# Patient Record
Sex: Female | Born: 1955 | Race: White | Hispanic: No | Marital: Married | Smoking: Former smoker
Health system: Southern US, Community
[De-identification: ages and names within clinical notes are randomized; demographics above are authoritative.]

## PROBLEM LIST (undated history)

## (undated) DIAGNOSIS — K7460 Unspecified cirrhosis of liver: Secondary | ICD-10-CM

## (undated) DIAGNOSIS — Z9889 Other specified postprocedural states: Secondary | ICD-10-CM

## (undated) DIAGNOSIS — N200 Calculus of kidney: Secondary | ICD-10-CM

## (undated) DIAGNOSIS — K219 Gastro-esophageal reflux disease without esophagitis: Secondary | ICD-10-CM

## (undated) DIAGNOSIS — D696 Thrombocytopenia, unspecified: Secondary | ICD-10-CM

## (undated) DIAGNOSIS — K859 Acute pancreatitis without necrosis or infection, unspecified: Secondary | ICD-10-CM

## (undated) DIAGNOSIS — R7303 Prediabetes: Secondary | ICD-10-CM

## (undated) DIAGNOSIS — K7581 Nonalcoholic steatohepatitis (NASH): Secondary | ICD-10-CM

## (undated) DIAGNOSIS — Z85820 Personal history of malignant melanoma of skin: Secondary | ICD-10-CM

## (undated) HISTORY — PX: APPENDECTOMY: SHX54

## (undated) HISTORY — DX: Thrombocytopenia, unspecified: D69.6

## (undated) HISTORY — PX: VAGINAL HYSTERECTOMY: SUR661

## (undated) HISTORY — PX: SKIN BIOPSY: SHX1

## (undated) HISTORY — DX: Calculus of kidney: N20.0

## (undated) HISTORY — DX: Nonalcoholic steatohepatitis (NASH): K75.81

## (undated) HISTORY — DX: Other specified postprocedural states: Z98.890

## (undated) HISTORY — DX: Unspecified cirrhosis of liver: K74.60

## (undated) HISTORY — DX: Personal history of malignant melanoma of skin: Z85.820

## (undated) HISTORY — DX: Acute pancreatitis without necrosis or infection, unspecified: K85.90

## (undated) HISTORY — PX: CHOLECYSTECTOMY: SHX55

## (undated) HISTORY — DX: Gastro-esophageal reflux disease without esophagitis: K21.9

## (undated) HISTORY — DX: Prediabetes: R73.03

## (undated) HISTORY — PX: LITHOTRIPSY: SUR834

---
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
## 2011-09-24 ENCOUNTER — Encounter: Payer: BC Managed Care – PPO | Admitting: Internal Medicine

## 2011-09-24 DIAGNOSIS — D696 Thrombocytopenia, unspecified: Secondary | ICD-10-CM

## 2011-09-24 DIAGNOSIS — C439 Malignant melanoma of skin, unspecified: Secondary | ICD-10-CM

## 2011-11-24 ENCOUNTER — Encounter: Payer: BC Managed Care – PPO | Admitting: Internal Medicine

## 2011-11-24 DIAGNOSIS — C439 Malignant melanoma of skin, unspecified: Secondary | ICD-10-CM

## 2011-11-24 DIAGNOSIS — D693 Immune thrombocytopenic purpura: Secondary | ICD-10-CM

## 2011-12-08 ENCOUNTER — Encounter: Payer: BC Managed Care – PPO | Admitting: Internal Medicine

## 2011-12-08 DIAGNOSIS — K7460 Unspecified cirrhosis of liver: Secondary | ICD-10-CM

## 2011-12-08 DIAGNOSIS — R748 Abnormal levels of other serum enzymes: Secondary | ICD-10-CM

## 2011-12-08 DIAGNOSIS — D696 Thrombocytopenia, unspecified: Secondary | ICD-10-CM

## 2011-12-11 ENCOUNTER — Encounter (INDEPENDENT_AMBULATORY_CARE_PROVIDER_SITE_OTHER): Payer: Self-pay | Admitting: *Deleted

## 2012-01-25 ENCOUNTER — Other Ambulatory Visit (INDEPENDENT_AMBULATORY_CARE_PROVIDER_SITE_OTHER): Payer: Self-pay | Admitting: *Deleted

## 2012-01-25 ENCOUNTER — Other Ambulatory Visit (INDEPENDENT_AMBULATORY_CARE_PROVIDER_SITE_OTHER): Payer: Self-pay | Admitting: Internal Medicine

## 2012-01-25 ENCOUNTER — Telehealth (INDEPENDENT_AMBULATORY_CARE_PROVIDER_SITE_OTHER): Payer: Self-pay | Admitting: *Deleted

## 2012-01-25 ENCOUNTER — Encounter (INDEPENDENT_AMBULATORY_CARE_PROVIDER_SITE_OTHER): Payer: Self-pay | Admitting: Internal Medicine

## 2012-01-25 ENCOUNTER — Ambulatory Visit (INDEPENDENT_AMBULATORY_CARE_PROVIDER_SITE_OTHER): Payer: BC Managed Care – PPO | Admitting: Internal Medicine

## 2012-01-25 VITALS — BP 124/76 | HR 70 | Temp 100.6°F | Resp 20 | Ht 60.0 in | Wt 166.0 lb

## 2012-01-25 DIAGNOSIS — Z1211 Encounter for screening for malignant neoplasm of colon: Secondary | ICD-10-CM

## 2012-01-25 DIAGNOSIS — J329 Chronic sinusitis, unspecified: Secondary | ICD-10-CM | POA: Insufficient documentation

## 2012-01-25 DIAGNOSIS — K219 Gastro-esophageal reflux disease without esophagitis: Secondary | ICD-10-CM | POA: Insufficient documentation

## 2012-01-25 DIAGNOSIS — K7460 Unspecified cirrhosis of liver: Secondary | ICD-10-CM

## 2012-01-25 MED ORDER — AZITHROMYCIN 250 MG PO TABS: ORAL_TABLET | ORAL | Status: DC

## 2012-01-25 NOTE — Telephone Encounter (Signed)
Patient needs movi prep 

## 2012-01-25 NOTE — Progress Notes (Signed)
CONSULTING PHYSICIAN: Boris M. Darovsky, M.D.  PRIMARY CARE PHYSICIAN: Marrian Salvage, PA-C/Mark Leary Roca, D.O.  REASON FOR CONSULTATION: New diagnosis of cirrhosis.  HISTORY OF PRESENT ILLNESS: Stephanie Bauer is a 56 year old, Caucasian female,  who is being evaluated at request by Dr. Earma Reading whom she saw for  thrombocytopenia. The patient had abdominal ultrasound which suggested  cirrhotic changes. The patient's hepatitis B surface antigen and  hepatitis C virus antibody were negative. With this information, the  patient referred for further evaluation.  The patient reports that 2 years ago she was noted to have mildly  elevated transaminases, felt to be secondary to fatty liver, but she did  not have imaging or other studies. Lately, she has been feeling tired,  but she has been able to do her usual household work and baby-sit  grandchildren without any difficulty. There is no history of icteric  hepatitis. Her appetite is fair. Since she found out she has  cirrhosis, she has been more active and has lost 8 pounds over a period  of 2 months. She has had occasional nausea and pain over right rib  cage, but not daily. She denies abdominal pain, melena, or rectal  bleeding. She has never been screened for colorectal carcinoma, and she  also has not received hep A or B vaccination. There is no history of  prior blood transfusion or tattoos. There is also no history of IV drug  use. She also denies pruritus.  CURRENT MEDICATIONS:  1. Alprazolam 0.5 mg p.o. at bedtime.  2. Alka-Seltzer plus p.o. daily p.r.n.  3. Clobetasol propionate cream applied to skin rash right leg daily  p.r.n.  4. __________ hydrogel to be applied to skin daily p.r.n.  5. Benadryl 25 mg p.o. q.6 p.r.n.  6. Citalopram 10 mg p.o. daily.  7. Estradiol 0.05 mg per 24-hour patch to skin twice weekly.  8. Omeprazole 20 mg p.o. q.a.m.  9. Oxymetazoline nasal spray 2 sprays to each nostril daily.  REVIEW OF THE SYSTEMS:  Negative for dysphagia. She feels the heartburn  is well controlled with therapy. She does complain of nasal congestion,  thick drainage, and low-grade fever.  PAST MEDICAL HISTORY: She has history of kidney stones initially  diagnosed 10 years ago. She has had lithotripsy. She did have single  episode of hematuria recently. She had skin lesion removed from her  anterior chest in July, which turned out to be melanoma and she had wide  excision. GERD of 2 years duration. She had vaginal hysterectomy 10  years ago.  History of skin rash for which she was seen by dermatologist, but does  not remember diagnosis. She has been on Escitalopram for mood swings  since she had hysterectomy. One month ago, she was told she is  borderline diabetic.  ALLERGIES: None known.  FAMILY HISTORY: Father was also borderline diabetic and died of CVA at  age 64. Mother is 54, is in good health. She has a younger sister, who  is accompanying her. She has ulcerative colitis, and she has a brother  age 54 who is in good health.  SOCIAL HISTORY: She is married. Her husband is accompanying her today.  They have 3 children and they are all in good health age is 54, 54, and  42. She worked in office for few years, but presently housewife. She  has never smoked cigarettes. She drinks alcohol socially, but not every  day. She has had more alcohol over the last 1-2 years, but no more  than  8-10 drinks per month.  PHYSICAL EXAMINATION: VITAL SIGNS: Weight 166 pounds. She is 54  inches tall. Pulse 70 per minute and regular, blood pressure 124/76,  respiratory rate is 20, and temp is 100.6. Her BMI is 32.42.  HEENT: Conjunctivae are pink. Sclerae are nonicteric. No calf rings  are noted. Oropharyngeal mucosa is normal.  NECK: No neck masses or thyromegaly noted. She has linear scar over  her anterior chest. No spider angioma are noted.  CARDIAC: With regular rhythm. Normal S1 and S2. No murmur or gallop  noted.  LUNGS:  Clear to auscultation.  ABDOMEN: Full. It is soft and nontender without hepatosplenomegaly.  EXTREMITIES: No peripheral edema or clubbing noted. Asterixis absent.  LABORATORY DATA: From November 24, 2011; WBC 5.0, H and H 13.9 and  40.9, MCV 91.6, platelet count 75,000. Glucose 122, BUN 16, creatinine  0.52. Serum sodium 138, potassium 3.5, chloride 103, CO2 of 28. Serum  calcium 9.0, bilirubin 0.9, AP 119, AST 66, ALT 55, total protein 8.1  with albumin of 3.8. CBC from December 08, 2011, WBC 4.6, H and H 14.1  and 41.5, platelet count 76,000. Lab data from September 24, 2011, hepatitis  B surface antibody is negative and hepatitis C virus antibody negative.  Upper abdominal ultrasound from November 30, 2011, reveals nodular  hepatic contour suggesting cirrhosis. No hepatic lesions identified.  No evidence of cholelithiasis or gallbladder wall thickening. Spleen  measures 11.7. No evidence of ascites.  ASSESSMENT: Stephanie Bauer is a 56 year old, Caucasian female, who was recently  evaluated for thrombocytopenia and mildly elevated transaminases and  suspected to have cirrhosis based on hepatic contour on ultrasonography.  She has history of elevated transaminases initially noted about 2 years  ago, and felt to be fatty liver. She has weight problems, but her BMI  is only 32.4. Therefore, I am not convinced that her cirrhosis or  chronic liver disease is secondary to non-alcoholic fatty liver disease.  It is possible that she has cryptogenic cirrhosis or other etiology.  Even though, she has thrombocytopenia, hepatic function appears to be  well preserved. She definitely needs to become more physically active  and try to lose another 25 pounds, which would help a great deal even if  primary cause of a liver disease was not non-alcoholic fatty liver  disease.  The patient advised not to eat raw fish.  She will talk with Ms. Marrian Salvage, PA-C, about getting vaccination  for hepatitis A and B.  She  will go to the lab for sed rate, ANA, AMA, SMA, serum ceruloplasmin,  alpha-1 antitrypsin, serum ferritin, INR alpha-fetoprotein. I would  also check hepatitis B surface antigen.  We will schedule the patient for abdominopelvic CT with contrast. She  will also need to undergo EGD looking for varices along with screening  colonoscopy. Whether or not, she would need liver biopsy will be  deferred until all of the studies have been completed.  The patient and her husband had several questions that were answered. I  suggested that she should keep alcohol intake to minimum rather than  weekly.  We appreciate the opportunity to participate in the care of this nice  lady.

## 2012-01-25 NOTE — Patient Instructions (Signed)
Physician will contact you with results of blood work when completed. Abdominopelvic CT to be scheduled. EGD and colonoscopy to be scheduled. Do not eat raw fish and keep alcohol intake to minimal as discussed.

## 2012-01-26 MED ORDER — PEG-KCL-NACL-NASULF-NA ASC-C 100 G PO SOLR: 1.0000 | Freq: Once | ORAL | Status: DC

## 2012-01-27 ENCOUNTER — Telehealth (INDEPENDENT_AMBULATORY_CARE_PROVIDER_SITE_OTHER): Payer: Self-pay | Admitting: *Deleted

## 2012-01-27 LAB — ALPHA-1-ANTITRYPSIN: A-1 Antitrypsin, Ser: 154 mg/dL (ref 90–200)

## 2012-01-27 LAB — CERULOPLASMIN: Ceruloplasmin: 25 mg/dL (ref 20–60)

## 2012-01-27 NOTE — Consult Note (Signed)
Stephanie Bauer, Stephanie Bauer               ACCOUNT NO.:  [id number]  MEDICAL RECORD NO.:  [id number]  LOCATION:                                 FACILITY:  PHYSICIAN:  Lionel December, M.D.    DATE OF BIRTH:  1956-01-04  DATE OF CONSULTATION:  01/25/2012 DATE OF DISCHARGE:                                CONSULTATION   CONSULTING PHYSICIAN:  Boris M. Darovsky, M.D.  PRIMARY CARE PHYSICIAN:  Marrian Salvage, PA-C/Mark Leary Roca, D.O.  REASON FOR CONSULTATION:  New diagnosis of cirrhosis.  HISTORY OF PRESENT ILLNESS:  Stephanie Bauer is a 56 year old, Caucasian female, who is being evaluated at request by Dr. Earma Reading whom she saw for thrombocytopenia.  The patient had abdominal ultrasound which suggested cirrhotic changes.  The patient's hepatitis B surface antigen and hepatitis C virus antibody were negative.  With this information, the patient referred for further evaluation.  The patient reports that 2 years ago she was noted to have mildly elevated transaminases, felt to be secondary to fatty liver, but she did not have imaging or other studies.  Lately, she has been feeling tired, but she has been able to do her usual household work and baby-sit grandchildren without any difficulty.  There is no history of icteric hepatitis.  Her appetite is fair.  Since she found out she has cirrhosis, she has been more active and has lost 8 pounds over a period of 2 months.  She has had occasional nausea and pain over right rib cage, but not daily.  She denies abdominal pain, melena, or rectal bleeding.  She has never been screened for colorectal carcinoma, and she also has not received hep A or B vaccination.  There is no history of prior blood transfusion or tattoos.  There is also no history of IV drug use.  She also denies pruritus.  CURRENT MEDICATIONS: 1. Alprazolam 0.5 mg p.o. at bedtime. 2. Alka-Seltzer plus p.o. daily p.r.n. 3. Clobetasol propionate cream applied to skin rash right leg daily  p.r.n. 4. __________ hydrogel to be applied to skin daily p.r.n. 5. Benadryl 25 mg p.o. q.6 p.r.n. 6. Citalopram 10 mg p.o. daily. 7. Estradiol 0.05 mg per 24-hour patch to skin twice weekly. 8. Omeprazole 20 mg p.o. q.a.m. 9. Oxymetazoline nasal spray 2 sprays to each nostril daily.  REVIEW OF THE SYSTEMS:  Negative for dysphagia.  She feels the heartburn is well controlled with therapy.  She does complain of nasal congestion, thick drainage, and low-grade fever.  PAST MEDICAL HISTORY:  She has history of kidney stones initially diagnosed 10 years ago.  She has had lithotripsy.  She did have single episode of hematuria recently.  She had skin lesion removed from her anterior chest in July, which turned out to be melanoma and she had wide excision.  GERD of 2 years duration.  She had vaginal hysterectomy 10 years ago.  History of skin rash for which she was seen by dermatologist, but does not remember diagnosis.  She has been on Escitalopram for mood swings since she had hysterectomy.  One month ago, she was told she is borderline diabetic.  ALLERGIES:  None known.  FAMILY HISTORY:  Father was also  borderline diabetic and died of CVA at age 34.  Mother is 19, is in good health.  She has a younger sister, who is accompanying her.  She has ulcerative colitis, and she has a brother age 39 who is in good health.  SOCIAL HISTORY:  She is married.  Her husband is accompanying her today. They have 3 children and they are all in good health age is 29, 21, and 76.  She worked in office for few years, but presently housewife.  She has never smoked cigarettes.  She drinks alcohol socially, but not every day.  She has had more alcohol over the last 1-2 years, but no more than 8-10 drinks per month.  PHYSICAL EXAMINATION:  VITAL SIGNS:  Weight 166 pounds.  She is 60 inches tall.  Pulse 70 per minute and regular, blood pressure 124/76, respiratory rate is 20, and temp is 100.6.  Her BMI is  32.42. HEENT:  Conjunctivae are pink.  Sclerae are nonicteric.  No calf rings are noted.  Oropharyngeal mucosa is normal. NECK:  No neck masses or thyromegaly noted.  She has linear scar over her anterior chest.  No spider angioma are noted. CARDIAC:  With regular rhythm.  Normal S1 and S2.  No murmur or gallop noted. LUNGS:  Clear to auscultation. ABDOMEN:  Full.  It is soft and nontender without hepatosplenomegaly. EXTREMITIES:  No peripheral edema or clubbing noted.  Asterixis absent.  LABORATORY DATA:  From November 24, 2011; WBC 5.0, H and H 13.9 and 40.9, MCV 91.6, platelet count 75,000.  Glucose 122, BUN 16, creatinine 0.52.  Serum sodium 138, potassium 3.5, chloride 103, CO2 of 28.  Serum calcium 9.0, bilirubin 0.9, AP 119, AST 66, ALT 55, total protein 8.1 with albumin of 3.8.  CBC from December 08, 2011, WBC 4.6, H and H 14.1 and 41.5, platelet count 76,000.  Lab data from September 24, 2011, hepatitis B surface antibody is negative and hepatitis C virus antibody negative. Upper abdominal ultrasound from November 30, 2011, reveals nodular hepatic contour suggesting cirrhosis.  No hepatic lesions identified. No evidence of cholelithiasis or gallbladder wall thickening.  Spleen measures 11.7.  No evidence of ascites.  ASSESSMENT:  Stephanie Bauer is a 56 year old, Caucasian female, who was recently evaluated for thrombocytopenia and mildly elevated transaminases and suspected to have cirrhosis based on hepatic contour on ultrasonography. She has history of elevated transaminases initially noted about 2 years ago, and felt to be fatty liver.  She has weight problems, but her BMI is only 32.4.  Therefore, I am not convinced that her cirrhosis or chronic liver disease is secondary to non-alcoholic fatty liver disease. It is possible that she has cryptogenic cirrhosis or other etiology.  Even though, she has thrombocytopenia, hepatic function appears to be well preserved.  She definitely needs  to become more physically active and try to lose another 25 pounds, which would help a great deal even if primary cause of a liver disease was not non-alcoholic fatty liver disease.  The patient advised not to eat raw fish.  She will talk with Ms. Marrian Salvage, PA-C, about getting vaccination for hepatitis A and B.  She will go to the lab for sed rate, ANA, AMA, SMA, serum ceruloplasmin, alpha-1 antitrypsin, serum ferritin, INR alpha-fetoprotein.  I would also check hepatitis B surface antigen.  We will schedule the patient for abdominopelvic CT with contrast.  She will also need to undergo EGD looking for varices along with screening colonoscopy.  Whether or  not, she would need liver biopsy will be deferred until all of the studies have been completed.  The patient and her husband had several questions that were answered.  I suggested that she should keep alcohol intake to minimum rather than weekly.  We appreciate the opportunity to participate in the care of this nice lady.          ______________________________ Lionel December, M.D.     NR/MEDQ  D:  01/25/2012  T:  01/26/2012  Job:  [id number]  cc:   Lebron Conners. Darovsky, M.D. Fax: [phone]  Lorelei Pont, D.O.  Marrian Salvage, PA-C

## 2012-01-27 NOTE — Telephone Encounter (Signed)
Per Dr.Rehman need to add the Hep B Surface Antigen, I called [facility] spoke with Jamesetta So and this has been added on.

## 2012-02-01 ENCOUNTER — Ambulatory Visit (HOSPITAL_COMMUNITY)
Admission: RE | Admit: 2012-02-01 | Discharge: 2012-02-01 | Disposition: A | Discharge status: Home / Self Care | Payer: BC Managed Care – PPO | Source: Ambulatory Visit | Attending: Internal Medicine | Admitting: Internal Medicine

## 2012-02-01 DIAGNOSIS — K7460 Unspecified cirrhosis of liver: Secondary | ICD-10-CM | POA: Insufficient documentation

## 2012-02-01 MED ORDER — IOHEXOL 300 MG/ML  SOLN
100.0000 mL | Freq: Once | INTRAMUSCULAR | Status: AC | PRN
  Administered 2012-02-01: 100 mL via INTRAVENOUS

## 2012-02-03 ENCOUNTER — Encounter (HOSPITAL_COMMUNITY): Payer: Self-pay | Admitting: Pharmacy Technician

## 2012-02-10 ENCOUNTER — Encounter (INDEPENDENT_AMBULATORY_CARE_PROVIDER_SITE_OTHER): Payer: Self-pay

## 2012-02-18 MED ORDER — SODIUM CHLORIDE 0.45 % IV SOLN
INTRAVENOUS | Status: DC
  Administered 2012-02-19: 1000 mL via INTRAVENOUS

## 2012-02-19 ENCOUNTER — Encounter (HOSPITAL_COMMUNITY)
Admission: RE | Disposition: A | Discharge status: Home / Self Care | Payer: Self-pay | Source: Ambulatory Visit | Attending: Internal Medicine

## 2012-02-19 ENCOUNTER — Encounter (HOSPITAL_COMMUNITY): Payer: Self-pay | Admitting: *Deleted

## 2012-02-19 ENCOUNTER — Ambulatory Visit (HOSPITAL_COMMUNITY)
Admission: RE | Admit: 2012-02-19 | Discharge: 2012-02-19 | Disposition: A | Discharge status: Home / Self Care | Payer: BC Managed Care – PPO | Source: Ambulatory Visit | Attending: Internal Medicine | Admitting: Internal Medicine

## 2012-02-19 DIAGNOSIS — I8500 Esophageal varices without bleeding: Secondary | ICD-10-CM

## 2012-02-19 DIAGNOSIS — K766 Portal hypertension: Secondary | ICD-10-CM | POA: Insufficient documentation

## 2012-02-19 DIAGNOSIS — Z1211 Encounter for screening for malignant neoplasm of colon: Secondary | ICD-10-CM

## 2012-02-19 DIAGNOSIS — K7460 Unspecified cirrhosis of liver: Secondary | ICD-10-CM

## 2012-02-19 DIAGNOSIS — K5730 Diverticulosis of large intestine without perforation or abscess without bleeding: Secondary | ICD-10-CM | POA: Insufficient documentation

## 2012-02-19 DIAGNOSIS — K319 Disease of stomach and duodenum, unspecified: Secondary | ICD-10-CM | POA: Insufficient documentation

## 2012-02-19 DIAGNOSIS — K6389 Other specified diseases of intestine: Secondary | ICD-10-CM

## 2012-02-19 HISTORY — PX: COLONOSCOPY WITH ESOPHAGOGASTRODUODENOSCOPY (EGD): SHX5779

## 2012-02-19 SURGERY — COLONOSCOPY WITH ESOPHAGOGASTRODUODENOSCOPY (EGD)
Anesthesia: Moderate Sedation

## 2012-02-19 MED ORDER — MEPERIDINE HCL 50 MG/ML IJ SOLN
INTRAMUSCULAR | Status: AC
  Filled 2012-02-19: qty 1

## 2012-02-19 MED ORDER — STERILE WATER FOR IRRIGATION IR SOLN
Status: DC | PRN
  Administered 2012-02-19: 17:00:00

## 2012-02-19 MED ORDER — MEPERIDINE HCL 50 MG/ML IJ SOLN
INTRAMUSCULAR | Status: DC | PRN
  Administered 2012-02-19 (×2): 25 mg via INTRAVENOUS

## 2012-02-19 MED ORDER — BUTAMBEN-TETRACAINE-BENZOCAINE 2-2-14 % EX AERO
INHALATION_SPRAY | CUTANEOUS | Status: DC | PRN
  Administered 2012-02-19: 2 via TOPICAL

## 2012-02-19 MED ORDER — MIDAZOLAM HCL 5 MG/5ML IJ SOLN
INTRAMUSCULAR | Status: DC | PRN
  Administered 2012-02-19 (×5): 2 mg via INTRAVENOUS

## 2012-02-19 MED ORDER — MIDAZOLAM HCL 5 MG/5ML IJ SOLN
INTRAMUSCULAR | Status: AC
  Filled 2012-02-19: qty 10

## 2012-02-19 NOTE — H&P (Signed)
Stephanie Bauer is an 56 y.o. female.   Chief Complaint: Patient is here for EGD and colonoscopy. HPI: Patient is 56 year old Caucasian female who was recently diagnosed cirrhosis. 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 She has well preserved hepatic function. She is also undergoing average risk screening colonoscopy. Since she was seen in the office a few weeks ago she has been exercising regularly and has lost few pounds  Past Medical History  Diagnosis Date  . Borderline diabetes   . GERD (gastroesophageal reflux disease)   . Thrombocytopenia   . Cirrhosis   . Kidney calculus     Past Surgical History  Procedure Date  . Vaginal hysterectomy   . Appendectomy   . Lithotripsy   . Cesarean section     x2    Family History  Problem Relation Age of Onset  . Healthy Mother   . Diabetes Father   . Healthy Brother   . Healthy Son   . Healthy Son   . Healthy Son    Social History:  reports that she has quit smoking. She started smoking about 30 years ago. She has never used smokeless tobacco. She reports that she does not use illicit drugs. Her alcohol history not on file.  Allergies: No Known Allergies  Medications Prior to Admission  Medication Sig Dispense Refill  . ALPRAZolam (XANAX) 0.5 MG tablet Take 0.5 mg by mouth at bedtime as needed. Sleep.      Marland Kitchen CLOBETASOL PROPIONATE EX Apply 1 application topically daily as needed. Scabs.      Marland Kitchen escitalopram (LEXAPRO) 10 MG tablet Take 10 mg by mouth daily.      Marland Kitchen estradiol (VIVELLE-DOT) 0.05 MG/24HR Place 1 patch onto the skin. Twice weekly      . omeprazole (PRILOSEC) 20 MG capsule Take 20 mg by mouth daily.      . peg 3350 powder (MOVIPREP) 100 G SOLR Take 1 kit (100 g total) by mouth once.  1 kit  0    No results found for this or any previous visit (from the past 48 hour(s)). No results found.  ROS  Blood pressure 125/84, pulse 70, temperature 97.9 F (36.6 C), temperature source Oral,  resp. rate 20, height 5' (1.524 m), weight 157 lb (71.215 kg), SpO2 97.00%. Physical Exam  Constitutional: She appears well-developed and well-nourished.  HENT:  Mouth/Throat: Oropharynx is clear and moist.  Eyes: Conjunctivae normal are normal. No scleral icterus.  Neck: No thyromegaly present.  Cardiovascular: Normal rate, regular rhythm and normal heart sounds.   No murmur heard. Respiratory: Effort normal and breath sounds normal.  GI: Soft. She exhibits no distension and no mass. There is no tenderness.  Musculoskeletal: She exhibits no edema.  Lymphadenopathy:    She has no cervical adenopathy.  Neurological: She is alert.  Skin: Skin is warm and dry.     Assessment/Plan New diagnosis of cirrhosis. EGD looking for various. Average risk screening colonoscopy  Stephanie Bauer U 02/19/2012, 4:37 PM

## 2012-02-19 NOTE — Op Note (Signed)
EGD PROCEDURE REPORT  PATIENT:  Stephanie Bauer  MR#:  [id number] Birthdate:  [birth year], 56 y.o., female Endoscopist:  Dr. Malissa Hippo, MD Referred By:  Dr. Earma Reading, MD Procedure Date: 02/19/2012  Procedure:   EGD & Colonoscopy.  Indications:  Patient is 56 year old Caucasian female with recent diagnosis of cirrhosis presumed to be due to NAFLD. She has compensated hepatic function in her platelet count has been 70,000 range. She is undergoing EGD to look for varices followed by average risk screening colonoscopy.            Informed Consent:  The risks, benefits, alternatives & imponderables which include, but are not limited to, bleeding, infection, perforation, drug reaction and potential missed lesion have been reviewed.  The potential for biopsy, lesion removal, esophageal dilation, etc. have also been discussed.  Questions have been answered.  All parties agreeable.  Please see history & physical in medical record for more information.  Medications:  Demerol 50 mg IV Versed 10 mg IV Cetacaine spray topically for oropharyngeal anesthesia  EGD  Description of procedure:  The endoscope was introduced through the mouth and advanced to the second portion of the duodenum without difficulty or limitations. The mucosal surfaces were surveyed very carefully during advancement of the scope and upon withdrawal.  Findings:  Esophagus:  Mucosa of the proximal and middle third was normal. There were 3 columns of very short grade 1 esophageal varices. GEJ:  37 cm Stomach:  Stomach was empty and distended very well with insufflation. Folds in the proximal stomach were normal. Examination mucosa revealed focal edema and erythema at gastric body. No erosions or ulcers were noted. Pyloric channel was patent. Angularis fundus and cardia were examined by retroflexing the scope and were normal. No fundal varices identified. Duodenum:  Normal bulbar and post bulbar  mucosa.  Therapeutic/Diagnostic Maneuvers Performed:  None  COLONOSCOPY Description of procedure:  After a digital rectal exam was performed, that colonoscope was advanced from the anus through the rectum and colon to the area of the cecum, ileocecal valve and appendiceal orifice. The cecum was deeply intubated. These structures were well-seen and photographed for the record. From the level of the cecum and ileocecal valve, the scope was slowly and cautiously withdrawn. The mucosal surfaces were carefully surveyed utilizing scope tip to flexion to facilitate fold flattening as needed. The scope was pulled down into the rectum where a thorough exam including retroflexion was performed.  Findings:  Prep satisfactory. Few small diverticula at sigmoid colon. Normal rectal mucosa. Two tiny anal papillae  Therapeutic/Diagnostic Maneuvers Performed:  None  Complications:  None  Cecal Withdrawal Time:  8  minutes  Impression:  3 short columns of grade 1 esophageal varices. Mild portal gastropathy. Normal colonoscopy except for few small elliptical at sigmoid colon and two anal papillae.  Recommendations:  Standard instructions given. High fiber diet. Office visit in three months.  Neda Willenbring U  02/19/2012 5:18 PM  CC: Dr. Marrian Salvage, PA & Dr. Bonnetta Barry ref. provider found         Dr. Earma Reading, MD

## 2012-02-23 ENCOUNTER — Encounter (HOSPITAL_COMMUNITY): Payer: Self-pay | Admitting: Internal Medicine

## 2012-05-05 ENCOUNTER — Encounter (INDEPENDENT_AMBULATORY_CARE_PROVIDER_SITE_OTHER): Payer: Self-pay | Admitting: *Deleted

## 2012-05-23 ENCOUNTER — Ambulatory Visit (INDEPENDENT_AMBULATORY_CARE_PROVIDER_SITE_OTHER): Payer: BC Managed Care – PPO | Admitting: Internal Medicine

## 2012-07-04 ENCOUNTER — Encounter (INDEPENDENT_AMBULATORY_CARE_PROVIDER_SITE_OTHER): Payer: Self-pay | Admitting: *Deleted

## 2012-07-11 ENCOUNTER — Encounter (INDEPENDENT_AMBULATORY_CARE_PROVIDER_SITE_OTHER): Payer: Self-pay | Admitting: Internal Medicine

## 2012-07-11 ENCOUNTER — Ambulatory Visit (INDEPENDENT_AMBULATORY_CARE_PROVIDER_SITE_OTHER): Payer: BC Managed Care – PPO | Admitting: Internal Medicine

## 2012-07-11 VITALS — BP 110/80 | HR 74 | Temp 99.9°F | Resp 18 | Ht 60.0 in | Wt 160.8 lb

## 2012-07-11 DIAGNOSIS — K7460 Unspecified cirrhosis of liver: Secondary | ICD-10-CM

## 2012-07-11 DIAGNOSIS — R7401 Elevation of levels of liver transaminase levels: Secondary | ICD-10-CM

## 2012-07-11 DIAGNOSIS — D696 Thrombocytopenia, unspecified: Secondary | ICD-10-CM

## 2012-07-11 LAB — CBC
HCT: 40.4 % (ref 36.0–46.0)
Hemoglobin: 13.9 g/dL (ref 12.0–15.0)
MCH: 30.3 pg (ref 26.0–34.0)
MCHC: 34.4 g/dL (ref 30.0–36.0)
MCV: 88 fL (ref 78.0–100.0)
Platelets: 89 K/uL — ABNORMAL LOW (ref 150–400)
RBC: 4.59 MIL/uL (ref 3.87–5.11)
RDW: 13.7 % (ref 11.5–15.5)
WBC: 11.9 K/uL — ABNORMAL HIGH (ref 4.0–10.5)

## 2012-07-11 LAB — COMPREHENSIVE METABOLIC PANEL
ALT: 34 U/L (ref 0–35)
Albumin: 4.1 g/dL (ref 3.5–5.2)
CO2: 24 mEq/L (ref 19–32)
Chloride: 102 mEq/L (ref 96–112)
Glucose, Bld: 98 mg/dL (ref 70–99)
Potassium: 3.9 mEq/L (ref 3.5–5.3)
Sodium: 141 mEq/L (ref 135–145)
Total Protein: 7.5 g/dL (ref 6.0–8.3)

## 2012-07-11 NOTE — Patient Instructions (Addendum)
Please schedule a visit with primary care physician to receive hepatitis A and B vaccination. Physician Will contact her with results of blood work.

## 2012-07-11 NOTE — Progress Notes (Signed)
Presenting complaint;  Followup for cirrhosis.  Subjective:  Patient is 57 year old Caucasian female with history of cirrhosis who presents for scheduled visit. She was last seen in November 2013. Cirrhosis is felt to be secondary to NAFLD since her workup has been negative. She also had EGD and a colonoscopy last year. EGD revealed 3 columns of grade 1 esophageal varices and portal gastropathy. She has not received hepatitis A or B. vaccination as recommended. She is having problems with sinusitis again and was plan to see Ms. Marrian Salvage PA later today. She also complains of pain in her hip and knee joints. She has lost 6 pounds since her last visit. She has become more active and walks regularly. She denies abdominal pain distention or problems with LE edema.  Current Medications: Current Outpatient Prescriptions  Medication Sig Dispense Refill  . ALPRAZolam (XANAX) 0.5 MG tablet Take 0.5 mg by mouth at bedtime as needed. Sleep.      Marland Kitchen BIOTIN PO Take 10,000 mcg by mouth daily.      Marland Kitchen CRANBERRY FRUIT PO Take 4,200 mg by mouth daily.      Marland Kitchen estradiol (VIVELLE-DOT) 0.05 MG/24HR Place 1 patch onto the skin. Patient changes twice a week      . Glucosamine-Chondroit-Vit C-Mn (GLUCOSAMINE CHONDR 1500 COMPLX) CAPS Take by mouth 4 (four) times daily.      Marland Kitchen guaiFENesin-codeine 100-10 MG/5ML syrup Take 5 mLs by mouth at bedtime.       . Multiple Vitamins-Minerals (MULTIPLE VITAMINS/WOMENS PO) Take by mouth daily.      Marland Kitchen omeprazole (PRILOSEC) 20 MG capsule Take 20 mg by mouth daily.       No current facility-administered medications for this visit.     Objective: Blood pressure 110/80, pulse 74, temperature 99.9 F (37.7 C), temperature source Oral, resp. rate 18, height 5' (1.524 m), weight 160 lb 12.8 oz (72.938 kg). Patient is alert and in no acute distress. No asterixis noted. Conjunctiva is pink. Sclera is nonicteric Oropharyngeal mucosa is normal. No neck masses or thyromegaly  noted. Cardiac exam with regular rhythm normal S1 and S2. No murmur or gallop noted. Lungs are clear to auscultation. Abdomen is full but soft and nontender without organomegaly or masses.  No LE edema or clubbing noted.   Assessment:  #1. 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 She has well preserved hepatic function. Diagnosis based on hepatic contour on CT and she also has thrombocytopenia and grade 1 esophageal varices. She needs to keep up with physical activity and try to lose another 10 pounds. Liver biopsy would help in her management as it would allow [place] to determine degree of fibrosis and may also shed light on etiology.    Plan:  Patient rice to talk with Ms. Marrian Salvage [facility] but getting hepatitis A and B. Vaccination. She will go to the lab for CBC, comprehensive chemistry panel and AFP. Will schedule ultrasound guided liver biopsy or transjugular liver biopsy depending on her platelet count. Office visit in 6 months.

## 2012-07-12 LAB — AFP TUMOR MARKER: AFP-Tumor Marker: 6.5 ng/mL (ref 0.0–8.0)

## 2012-07-13 ENCOUNTER — Other Ambulatory Visit (INDEPENDENT_AMBULATORY_CARE_PROVIDER_SITE_OTHER): Payer: Self-pay | Admitting: Internal Medicine

## 2012-07-13 DIAGNOSIS — K7460 Unspecified cirrhosis of liver: Secondary | ICD-10-CM

## 2013-01-10 ENCOUNTER — Ambulatory Visit (INDEPENDENT_AMBULATORY_CARE_PROVIDER_SITE_OTHER): Payer: BC Managed Care – PPO | Admitting: Internal Medicine

## 2013-12-06 IMAGING — CT CT ABD-PEL WO/W CM
2 of 9 series · 10 of 46 positions shown, 15 images · IV contrast (Omnipaque 300)
Comparison: Abdominal ultrasound from 11/30/2011.

CLINICAL DATA: Elevated LFTs.  Question cirrhosis.

CT ABDOMEN AND PELVIS WITHOUT AND WITH CONTRAST
TECHNIQUE: Multidetector CT imaging of the abdomen and pelvis was
performed without contrast material in one or both body regions,
followed by contrast material(s) and further sections in one or
both body regions.
Contrast: 100mL OMNIPAQUE IOHEXOL 300 MG/ML  SOLN

[Series 4: mpr arterial cor 3.0mm · coronal · arterial · 0.46mm/px · 3 of 81 slices shown]
[im 21/81  soft-tissue]
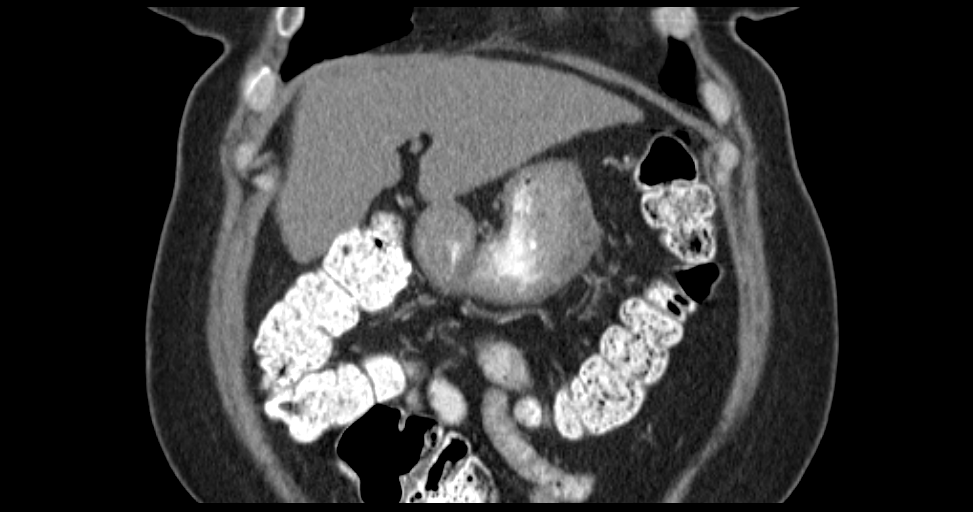
[im 41/81  soft-tissue]
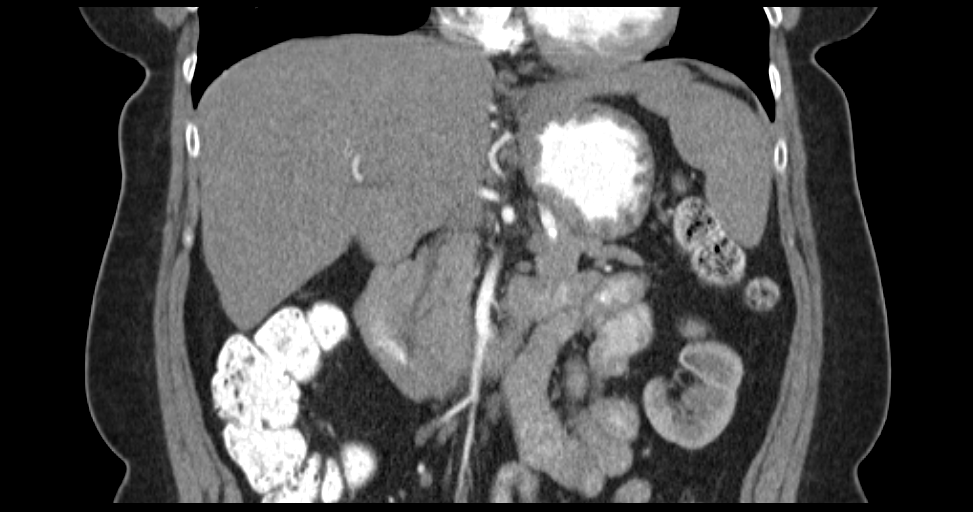
[im 61/81  soft-tissue]
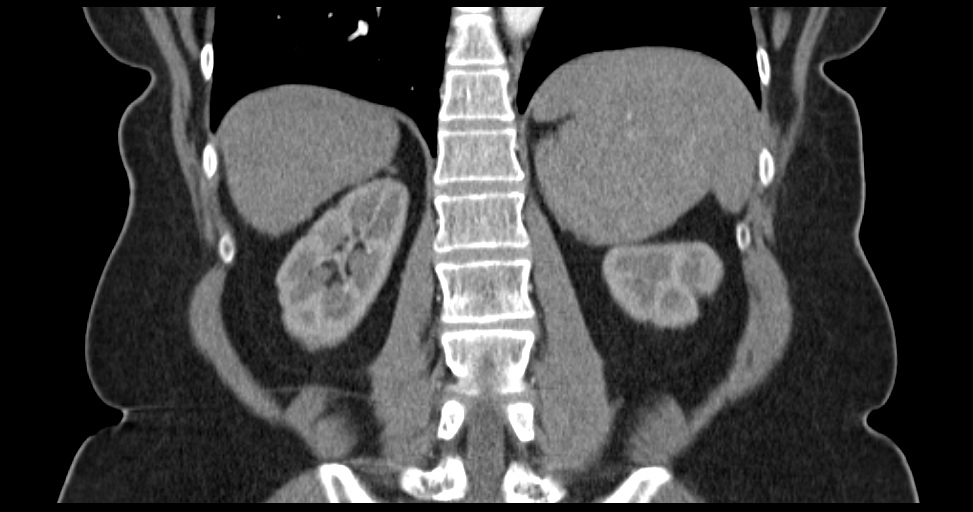

[Series 7: venous 60 sec 3.0 b40f · axial · portal-venous · 0.71mm/px · z∈[-384,-78]mm · 7 of 137 slices shown, 12 images]
[im 18/137  soft-tissue]
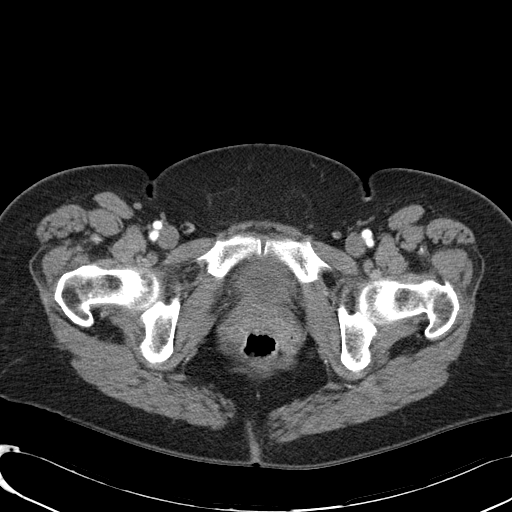
[im 18/137  bone]
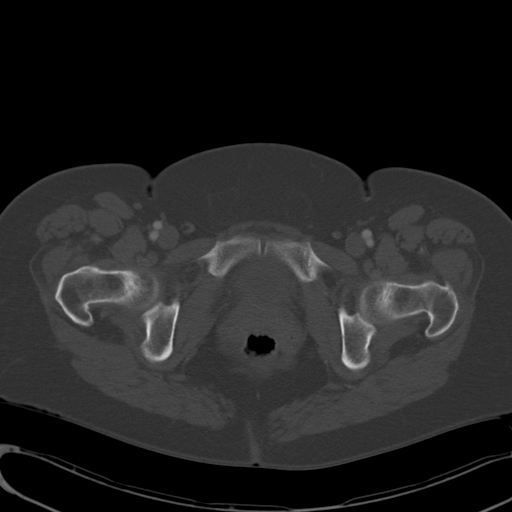
[im 35/137  soft-tissue]
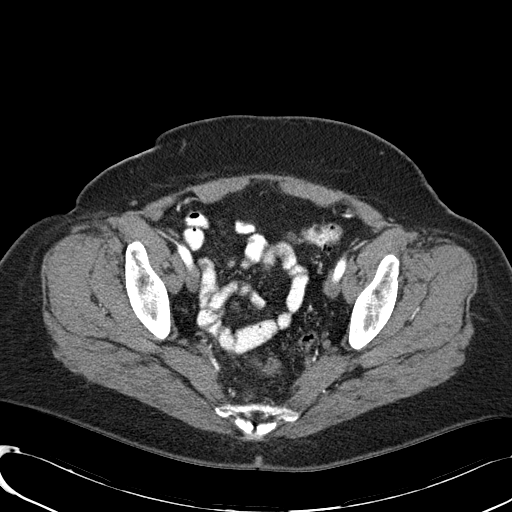
[im 52/137  soft-tissue]
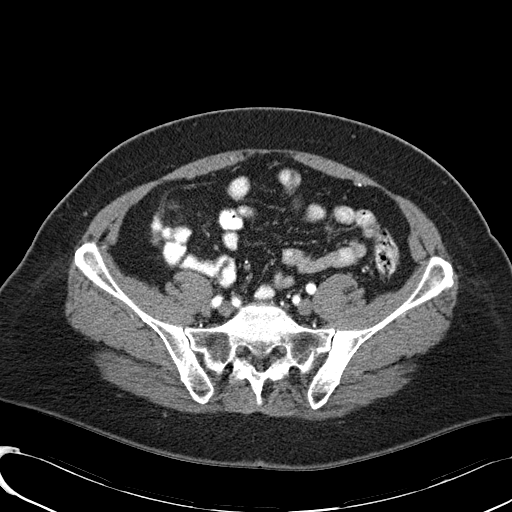
[im 69/137  soft-tissue]
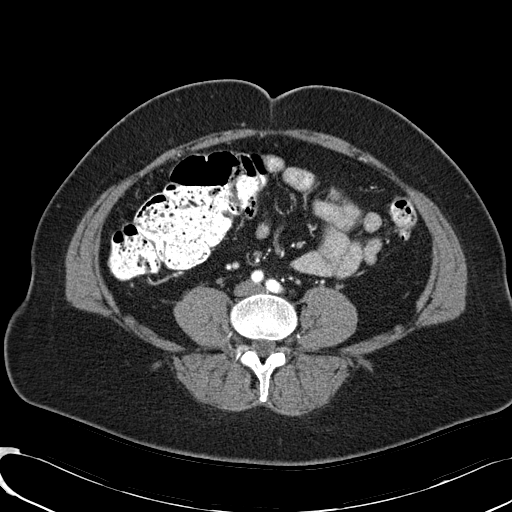
[im 69/137  lung]
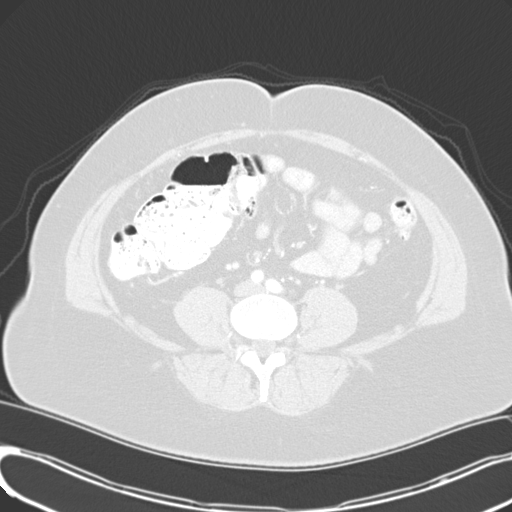
[im 86/137  soft-tissue]
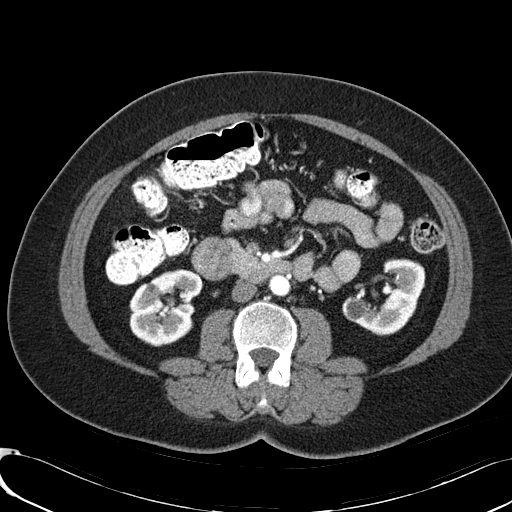
[im 86/137  lung]
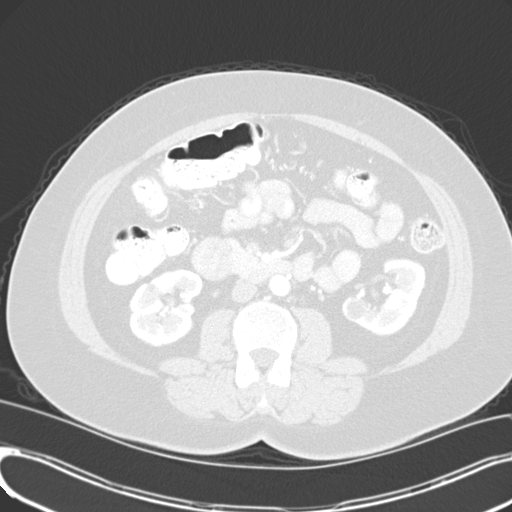
[im 103/137  soft-tissue]
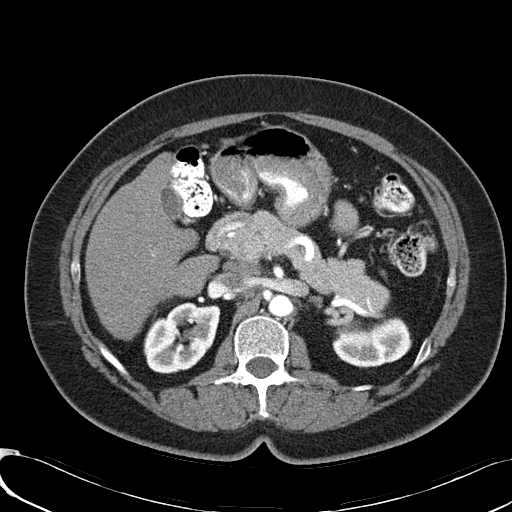
[im 103/137  lung]
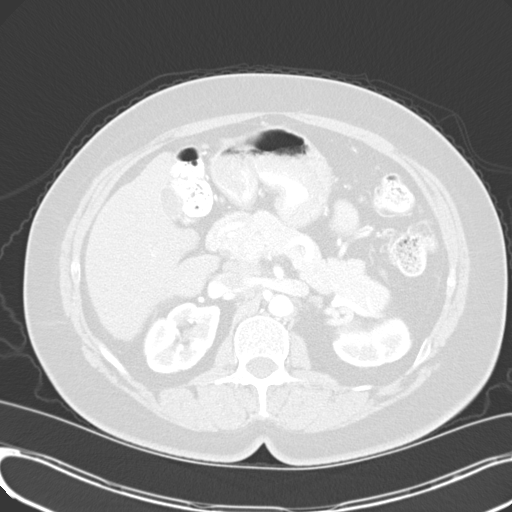
[im 120/137  soft-tissue]
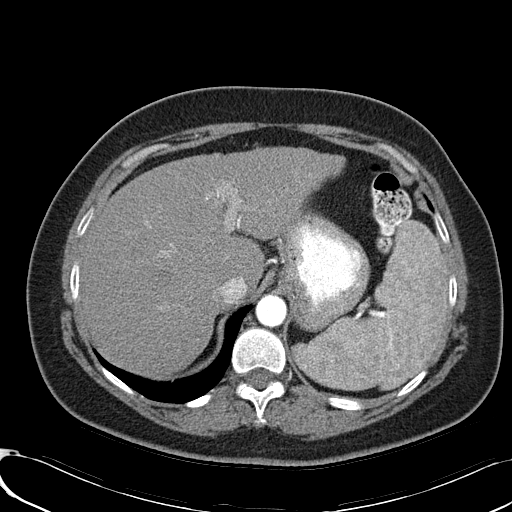
[im 120/137  lung]
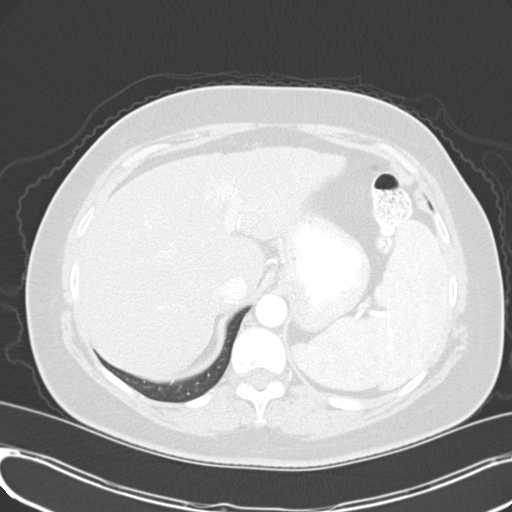

[10 of 46 positions shown; findings below may reference images not displayed]

FINDINGS: Precontrast imaging shows peripheral nodular hepatic
contour.  Five tiny nonobstructing stones are seen in the left
kidney.  No definite stones in the right kidney.

Arterial phase imaging after IV contrast administration shows no
evidence for hypervascular liver lesion. Portal venous and delayed
imaging through the liver is also without evidence for hepatic
focal mass lesion.  The portal vein, superior mesenteric vein, and
splenic vein are patent.

Liver measures 13.9 cm and cranial caudal length, upper normal.
The spleen measures 9 cm in cranial caudal length.  No focal
splenic abnormality.  The stomach, duodenum, pancreas, gallbladder,
right adrenal gland, and left adrenal gland are without focal
abnormality.  There are numerous tiny cortical low density lesions
in the right kidney, too small to characterize, but likely related
to cyst.  Similar numerous tiny cortical low density lesions are
seen in the left kidney.

Venous collaterals in the left upper quadrant to extend into the
left renal vein, likely secondary to splenorenal shunt.

No abdominal aortic aneurysm.  There is some borderline
retroperitoneal lymphadenopathy (1.8 x 1.4 cm aorto caval lymph
node seen on image 39 of series 7).

No abdominal aortic aneurysm.  No free fluid in the abdomen.

Imaging through the pelvis shows no free intraperitoneal fluid.
There is no pelvic sidewall lymphadenopathy.  Bladder is
decompressed.  Uterus is surgically absent.  No evidence for
adnexal mass.

Diverticular changes are seen in the left colon without
diverticulitis.  The terminal ileum is normal. Nonvisualization of
the appendix is consistent with the reported history of
appendectomy.

Tiny sclerotic focus in the right to T11 vertebral body is
indeterminate.  No other similar lesions are identified suggesting
this may represent a bone island.  The patient has some subtle
increased density in both iliac bones, near the SI joints, but this
is bilateral although slightly asymmetric.
IMPRESSION: Peripheral nodular contour compatible with cirrhosis.  There is no
intrahepatic parenchymal mass lesion on the current exam.

Although the portal vein appears patent, there is venous
collateralization in the left upper quadrant suggesting portal
venous hypertension.

Borderline retroperitoneal lymphadenopathy of indeterminate
etiology.

Multiple tiny nonobstructing left renal stones with numerous tiny
low-density cortical lesions in both kidneys, probably representing
cysts.

Subtle sclerotic changes in both iliac bones, near the SI joints.
This is asymmetric but bilateral and may represent degenerative
change or stress response.

## 2015-01-15 ENCOUNTER — Encounter (INDEPENDENT_AMBULATORY_CARE_PROVIDER_SITE_OTHER): Payer: Self-pay | Admitting: Internal Medicine

## 2015-01-15 ENCOUNTER — Ambulatory Visit (INDEPENDENT_AMBULATORY_CARE_PROVIDER_SITE_OTHER): Payer: Managed Care, Other (non HMO) | Admitting: Internal Medicine

## 2015-01-15 ENCOUNTER — Encounter (INDEPENDENT_AMBULATORY_CARE_PROVIDER_SITE_OTHER): Payer: Self-pay | Admitting: *Deleted

## 2015-01-15 DIAGNOSIS — K7460 Unspecified cirrhosis of liver: Secondary | ICD-10-CM

## 2015-01-15 NOTE — Progress Notes (Signed)
Subjective:    Patient ID: Stephanie Bauer, female    DOB: 11-30-1955, 59 y.o.   MRN: [id number]  HPI Referred by Dr Chauncey Reading for low platelets/cirrhosis. Patient had hx of same.  Hx of same and was seen by Dr. Laural Golden in 2013. Auto immune process was ruled out. Hepatitis B and C were ruled out.  Appetite is good. No weight loss. She says she really does not exercise.  Just started Hep A series. Second shot 07/02/2015. No abdominal pain. Usually has a BM daily. No melena or BRRB.     01/02/2015 H and H 14.3 and 40.8, Platelet ct 62 ALP 137, AST 35, ALT 26.   02/01/2012 CT abdomen/pelvis with CM: IMPRESSION: Peripheral nodular contour compatible with cirrhosis. There is no intrahepatic parenchymal mass lesion on the current exam. CBC    Component Value Date/Time   WBC 11.9* 07/11/2012 0955   RBC 4.59 07/11/2012 0955   HGB 13.9 07/11/2012 0955   HCT 40.4 07/11/2012 0955   PLT 89* 07/11/2012 0955   MCV 88.0 07/11/2012 0955   MCH 30.3 07/11/2012 0955   MCHC 34.4 07/11/2012 0955   RDW 13.7 07/11/2012 0955   02/01/2012 SMA negative, Alpha 1 antitrypsin 154, Hep B surface antigen negative, Hep C antibody negative, Hepatitic B surface AG negative. ANA 4. Ceruloplasmin 35.  07/26/2012 AFP 6.5.         02/19/2012 EGD & Colonoscopy.  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 She has compensated hepatic function in her platelet count has been 70,000 range. She is undergoing EGD to look for varices followed by average risk screening colonoscopy.  Impression:  3 short columns of grade 1 esophageal varices. Mild portal gastropathy. Normal colonoscopy except for few small elliptical at sigmoid colon and two anal papillae.  Review of Systems Past Medical History  Diagnosis Date  . Borderline  diabetes   . GERD (gastroesophageal reflux disease)   . Thrombocytopenia ([place])   . Kidney calculus   . Cirrhosis ([place])     diagnosed in 2013    Past Surgical History  Procedure Laterality Date  . Vaginal hysterectomy    . Appendectomy    . Lithotripsy    . Cesarean section      x2  . Colonoscopy with esophagogastroduodenoscopy (egd)  02/19/2012    Procedure: COLONOSCOPY WITH ESOPHAGOGASTRODUODENOSCOPY (EGD);  Surgeon: Rogene Houston, MD;  [facility]: [facility];  Service: Endoscopy;  Laterality: N/A;  135-changed to 14:25 Ann to notify pt    No Known Allergies  Current Outpatient Prescriptions on File Prior to Visit  Medication Sig Dispense Refill  . ALPRAZolam (XANAX) 0.5 MG tablet Take 0.5 mg by mouth at bedtime as needed. Sleep.    Marland Kitchen estradiol (VIVELLE-DOT) 0.05 MG/24HR Place 1 patch onto the skin. Patient changes twice a week    . omeprazole (PRILOSEC) 20 MG capsule Take 20 mg by mouth daily.     No current facility-administered medications on file prior to visit.        Objective:   Physical ExamBlood pressure 140/92, pulse 64, temperature 98.6 F (37 C), height 5' (1.524 m), weight 173 lb 3.2 oz (78.563 kg).  Alert and oriented. Skin warm and dry. Oral mucosa is moist.   . Sclera anicteric, conjunctivae is pink. Thyroid not enlarged. No cervical lymphadenopathy. Lungs clear. Heart regular rate and rhythm.  Abdomen is soft. Bowel sounds are positive. No hepatomegaly. No abdominal  masses felt. No tenderness.  No edema to lower extremities.         Assessment & Plan:  AFP, [place] elastrography, CBC. OV in 3 months. May need an EGD looking for varices at next OV

## 2015-01-15 NOTE — Patient Instructions (Addendum)
OV in 3 months. Diet and exercise

## 2015-01-16 ENCOUNTER — Encounter (INDEPENDENT_AMBULATORY_CARE_PROVIDER_SITE_OTHER): Payer: Self-pay

## 2015-01-16 LAB — CBC WITH DIFFERENTIAL/PLATELET
BASOS PCT: 1 % (ref 0–1)
Basophils Absolute: 0 10*3/uL (ref 0.0–0.1)
Eosinophils Absolute: 0.1 10*3/uL (ref 0.0–0.7)
Eosinophils Relative: 3 % (ref 0–5)
HEMATOCRIT: 40.8 % (ref 36.0–46.0)
HEMOGLOBIN: 14.6 g/dL (ref 12.0–15.0)
LYMPHS PCT: 53 % — AB (ref 12–46)
Lymphs Abs: 2.4 10*3/uL (ref 0.7–4.0)
MCH: 31.3 pg (ref 26.0–34.0)
MCHC: 35.8 g/dL (ref 30.0–36.0)
MCV: 87.4 fL (ref 78.0–100.0)
MONO ABS: 0.5 10*3/uL (ref 0.1–1.0)
MPV: 10.5 fL (ref 8.6–12.4)
Monocytes Relative: 11 % (ref 3–12)
NEUTROS ABS: 1.4 10*3/uL — AB (ref 1.7–7.7)
NEUTROS PCT: 32 % — AB (ref 43–77)
Platelets: 54 10*3/uL — ABNORMAL LOW (ref 150–400)
RBC: 4.67 MIL/uL (ref 3.87–5.11)
RDW: 14.3 % (ref 11.5–15.5)
WBC: 4.5 10*3/uL (ref 4.0–10.5)

## 2015-01-16 LAB — AFP TUMOR MARKER: AFP TUMOR MARKER: 8 ng/mL — AB (ref <6.1)

## 2015-01-21 ENCOUNTER — Ambulatory Visit (HOSPITAL_COMMUNITY)
Admission: RE | Admit: 2015-01-21 | Discharge: 2015-01-21 | Disposition: A | Discharge status: Home / Self Care | Payer: Managed Care, Other (non HMO) | Source: Ambulatory Visit | Attending: Internal Medicine | Admitting: Internal Medicine

## 2015-01-21 DIAGNOSIS — R748 Abnormal levels of other serum enzymes: Secondary | ICD-10-CM | POA: Insufficient documentation

## 2015-01-21 DIAGNOSIS — K7460 Unspecified cirrhosis of liver: Secondary | ICD-10-CM | POA: Insufficient documentation

## 2015-01-21 DIAGNOSIS — K760 Fatty (change of) liver, not elsewhere classified: Secondary | ICD-10-CM | POA: Insufficient documentation

## 2015-01-21 DIAGNOSIS — R161 Splenomegaly, not elsewhere classified: Secondary | ICD-10-CM | POA: Insufficient documentation

## 2015-01-22 ENCOUNTER — Encounter (INDEPENDENT_AMBULATORY_CARE_PROVIDER_SITE_OTHER): Payer: Self-pay | Admitting: Internal Medicine

## 2015-01-22 NOTE — Telephone Encounter (Signed)
OV in 3 months.  Possible esophageal banding at that visit.

## 2015-01-25 ENCOUNTER — Encounter (INDEPENDENT_AMBULATORY_CARE_PROVIDER_SITE_OTHER): Payer: Self-pay | Admitting: *Deleted

## 2015-04-17 ENCOUNTER — Encounter (INDEPENDENT_AMBULATORY_CARE_PROVIDER_SITE_OTHER): Payer: Self-pay | Admitting: *Deleted

## 2015-04-17 ENCOUNTER — Ambulatory Visit (INDEPENDENT_AMBULATORY_CARE_PROVIDER_SITE_OTHER): Payer: Managed Care, Other (non HMO) | Admitting: Internal Medicine

## 2015-04-17 ENCOUNTER — Encounter (INDEPENDENT_AMBULATORY_CARE_PROVIDER_SITE_OTHER): Payer: Self-pay | Admitting: Internal Medicine

## 2015-04-17 ENCOUNTER — Other Ambulatory Visit (INDEPENDENT_AMBULATORY_CARE_PROVIDER_SITE_OTHER): Payer: Self-pay | Admitting: Internal Medicine

## 2015-04-17 VITALS — BP 130/70 | HR 68 | Temp 97.2°F | Ht 60.0 in | Wt 175.3 lb

## 2015-04-17 DIAGNOSIS — K7460 Unspecified cirrhosis of liver: Secondary | ICD-10-CM | POA: Diagnosis not present

## 2015-04-17 DIAGNOSIS — I8500 Esophageal varices without bleeding: Secondary | ICD-10-CM

## 2015-04-17 LAB — PROTIME-INR
INR: 1.27 (ref <1.50)
Prothrombin Time: 16.1 seconds — ABNORMAL HIGH (ref 11.6–15.2)

## 2015-04-17 LAB — HEPATIC FUNCTION PANEL
ALBUMIN: 3.3 g/dL — AB (ref 3.6–5.1)
ALK PHOS: 105 U/L (ref 33–130)
ALT: 23 U/L (ref 6–29)
AST: 31 U/L (ref 10–35)
Bilirubin, Direct: 0.2 mg/dL (ref <=0.2)
Indirect Bilirubin: 0.8 mg/dL (ref 0.2–1.2)
TOTAL PROTEIN: 6.8 g/dL (ref 6.1–8.1)
Total Bilirubin: 1 mg/dL (ref 0.2–1.2)

## 2015-04-17 NOTE — Patient Instructions (Signed)
OV in 6 months. 

## 2015-04-17 NOTE — Progress Notes (Addendum)
Subjective:    Patient ID: Stephanie Bauer, female    DOB: 08-Mar-1956, 60 y.o.   MRN: [id number]  HPI  Here today for f/u of her NAFLD. Here workup for autoimmune process was negative. Hepatitis markers were negative. She tells me she is doing good. She is not doing any exercises. 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 Appetite is good. No weight loss. She usually has a BM 1-2 a day. She has gained 2 pounds since her last visit in November. She has started the Hepatitis A vaccine. Next is 06/2015.   01/15/2015 AFP: 8.0.   CBC    Component Value Date/Time   WBC 4.5 01/15/2015 1606   RBC 4.67 01/15/2015 1606   HGB 14.6 01/15/2015 1606   HCT 40.8 01/15/2015 1606   PLT 54* 01/15/2015 1606   MCV 87.4 01/15/2015 1606   MCH 31.3 01/15/2015 1606   MCHC 35.8 01/15/2015 1606   RDW 14.3 01/15/2015 1606   LYMPHSABS 2.4 01/15/2015 1606   MONOABS 0.5 01/15/2015 1606   EOSABS 0.1 01/15/2015 1606   BASOSABS 0.0 01/15/2015 1606   Hepatic Function Panel     Component Value Date/Time   PROT 7.5 07/11/2012 0955   ALBUMIN 4.1 07/11/2012 0955   AST 31 07/11/2012 0955   ALT 34 07/11/2012 0955   ALKPHOS 130* 07/11/2012 0955   BILITOT 0.9 07/11/2012 0955     02/19/2012 EGD & Colonoscopy.  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 She has compensated hepatic function in her platelet count has been 70,000 range. She is undergoing EGD to look for varices followed by average risk screening colonoscopy.  Impression:  3 short columns of grade 1 esophageal varices. Mild portal gastropathy. Normal colonoscopy except for few small elliptical at sigmoid colon and two anal papillae.    01/21/2015 [place] Elastrography:   IMPRESSION: 1. Cirrhosis and moderate diffuse hepatic steatosis. No liver mass detected. 2.  Moderate splenomegaly. No upper abdominal ascites. 3. Otherwise normal abdominal sonogram. 4. Hepatic elastography results:   Review of Systems Past Medical History  Diagnosis Date  . Borderline diabetes   . GERD (gastroesophageal reflux disease)   . Thrombocytopenia ([place])   . Kidney calculus   . Cirrhosis ([place])     diagnosed in 2013  . Hx of melanoma excision     2013    Past Surgical History  Procedure Laterality Date  . Vaginal hysterectomy    . Appendectomy    . Lithotripsy    . Cesarean section      x2  . Colonoscopy with esophagogastroduodenoscopy (egd)  02/19/2012    Procedure: COLONOSCOPY WITH ESOPHAGOGASTRODUODENOSCOPY (EGD);  Surgeon: Rogene Houston, MD;  [facility];  Service: Endoscopy;  Laterality: N/A;  135-changed to 14:25 Ann to notify pt    No Known Allergies  Current Outpatient Prescriptions on File Prior to Visit  Medication Sig Dispense Refill  . ALPRAZolam (XANAX) 0.5 MG tablet Take 0.5 mg by mouth at bedtime as needed. Sleep.    Marland Kitchen estradiol (VIVELLE-DOT) 0.05 MG/24HR Place 1 patch onto the skin. Patient changes twice a week    . omeprazole (PRILOSEC) 20 MG capsule Take 20 mg by mouth daily.     No current facility-administered medications on file prior to visit.        Objective:   Physical Exam Blood pressure 130/70, pulse 68, temperature 97.2 F (36.2 C), height 5' (1.524 m), weight 175 lb  4.8 oz (79.516 kg). Alert and oriented. Skin warm and dry. Oral mucosa is moist.   . Sclera anicteric, conjunctivae is pink. Thyroid not enlarged. No cervical lymphadenopathy. Lungs clear. Heart regular rate and rhythm.  Abdomen is soft. Bowel sounds are positive. No hepatomegaly. No abdominal masses felt. No tenderness.  No edema to lower extremities. Patient is alert and oriented.       Assessment & Plan:  NAFLD. She seems to be doing well.  Labs today.  EGD with possible banding.  OV in 6 months.

## 2015-04-18 LAB — CBC WITH DIFFERENTIAL/PLATELET
Basophils Absolute: 0.1 10*3/uL (ref 0.0–0.1)
Basophils Relative: 1 % (ref 0–1)
EOS ABS: 0.2 10*3/uL (ref 0.0–0.7)
EOS PCT: 4 % (ref 0–5)
HEMATOCRIT: 41 % (ref 36.0–46.0)
Hemoglobin: 14.3 g/dL (ref 12.0–15.0)
LYMPHS PCT: 48 % — AB (ref 12–46)
Lymphs Abs: 2.6 10*3/uL (ref 0.7–4.0)
MCH: 31 pg (ref 26.0–34.0)
MCHC: 34.9 g/dL (ref 30.0–36.0)
MCV: 88.9 fL (ref 78.0–100.0)
MONO ABS: 0.4 10*3/uL (ref 0.1–1.0)
MPV: 10.8 fL (ref 8.6–12.4)
Monocytes Relative: 8 % (ref 3–12)
Neutro Abs: 2.1 10*3/uL (ref 1.7–7.7)
Neutrophils Relative %: 39 % — ABNORMAL LOW (ref 43–77)
Platelets: 72 10*3/uL — ABNORMAL LOW (ref 150–400)
RBC: 4.61 MIL/uL (ref 3.87–5.11)
RDW: 14 % (ref 11.5–15.5)
WBC: 5.4 10*3/uL (ref 4.0–10.5)

## 2015-04-18 LAB — AFP TUMOR MARKER: AFP TUMOR MARKER: 7.9 ng/mL — AB (ref <6.1)

## 2015-04-22 ENCOUNTER — Telehealth (INDEPENDENT_AMBULATORY_CARE_PROVIDER_SITE_OTHER): Payer: Self-pay | Admitting: Internal Medicine

## 2015-04-22 NOTE — Telephone Encounter (Signed)
Ms. Rudden left a message returning your call Terri. She'd like a return phone call.  Pt's ph# [phone]  Thank you.

## 2015-04-22 NOTE — Telephone Encounter (Signed)
Message left at home 

## 2015-04-23 NOTE — Telephone Encounter (Signed)
I have called patient multiple times trying to give her lab work. Will try again tomorrow.

## 2015-04-24 NOTE — Telephone Encounter (Signed)
Stephanie Bauer left a message in regards to she and Stephanie Bauer missing each other on the phone. She said to try calling [phone] and she'll be home all day today. Thank you.

## 2015-05-23 ENCOUNTER — Encounter (HOSPITAL_COMMUNITY)
Admission: RE | Disposition: A | Discharge status: Home / Self Care | Payer: Self-pay | Source: Ambulatory Visit | Attending: Internal Medicine

## 2015-05-23 ENCOUNTER — Encounter (HOSPITAL_COMMUNITY): Payer: Self-pay | Admitting: *Deleted

## 2015-05-23 ENCOUNTER — Ambulatory Visit (HOSPITAL_COMMUNITY)
Admission: RE | Admit: 2015-05-23 | Discharge: 2015-05-23 | Disposition: A | Discharge status: Home / Self Care | Payer: Managed Care, Other (non HMO) | Source: Ambulatory Visit | Attending: Internal Medicine | Admitting: Internal Medicine

## 2015-05-23 DIAGNOSIS — K3189 Other diseases of stomach and duodenum: Secondary | ICD-10-CM | POA: Diagnosis not present

## 2015-05-23 DIAGNOSIS — K2980 Duodenitis without bleeding: Secondary | ICD-10-CM | POA: Diagnosis not present

## 2015-05-23 DIAGNOSIS — K219 Gastro-esophageal reflux disease without esophagitis: Secondary | ICD-10-CM | POA: Diagnosis not present

## 2015-05-23 DIAGNOSIS — Z87891 Personal history of nicotine dependence: Secondary | ICD-10-CM | POA: Insufficient documentation

## 2015-05-23 DIAGNOSIS — K269 Duodenal ulcer, unspecified as acute or chronic, without hemorrhage or perforation: Secondary | ICD-10-CM | POA: Insufficient documentation

## 2015-05-23 DIAGNOSIS — Z79899 Other long term (current) drug therapy: Secondary | ICD-10-CM | POA: Insufficient documentation

## 2015-05-23 DIAGNOSIS — K2990 Gastroduodenitis, unspecified, without bleeding: Secondary | ICD-10-CM

## 2015-05-23 DIAGNOSIS — Z85820 Personal history of malignant melanoma of skin: Secondary | ICD-10-CM | POA: Diagnosis not present

## 2015-05-23 DIAGNOSIS — K766 Portal hypertension: Secondary | ICD-10-CM | POA: Diagnosis not present

## 2015-05-23 DIAGNOSIS — K7460 Unspecified cirrhosis of liver: Secondary | ICD-10-CM

## 2015-05-23 DIAGNOSIS — R7303 Prediabetes: Secondary | ICD-10-CM | POA: Diagnosis not present

## 2015-05-23 DIAGNOSIS — I8500 Esophageal varices without bleeding: Secondary | ICD-10-CM | POA: Diagnosis not present

## 2015-05-23 DIAGNOSIS — K2970 Gastritis, unspecified, without bleeding: Secondary | ICD-10-CM

## 2015-05-23 HISTORY — PX: ESOPHAGEAL BANDING: SHX5518

## 2015-05-23 HISTORY — PX: ESOPHAGOGASTRODUODENOSCOPY: SHX5428

## 2015-05-23 SURGERY — EGD (ESOPHAGOGASTRODUODENOSCOPY)
Anesthesia: Moderate Sedation

## 2015-05-23 MED ORDER — MEPERIDINE HCL 50 MG/ML IJ SOLN
INTRAMUSCULAR | Status: AC
  Filled 2015-05-23: qty 1

## 2015-05-23 MED ORDER — STERILE WATER FOR IRRIGATION IR SOLN
Status: DC | PRN
  Administered 2015-05-23: 13:00:00

## 2015-05-23 MED ORDER — MEPERIDINE HCL 50 MG/ML IJ SOLN
INTRAMUSCULAR | Status: DC | PRN
  Administered 2015-05-23 (×2): 25 mg via INTRAVENOUS

## 2015-05-23 MED ORDER — MIDAZOLAM HCL 5 MG/5ML IJ SOLN
INTRAMUSCULAR | Status: AC
  Filled 2015-05-23: qty 10

## 2015-05-23 MED ORDER — SODIUM CHLORIDE 0.9 % IV SOLN
INTRAVENOUS | Status: DC
  Administered 2015-05-23: 12:00:00 via INTRAVENOUS

## 2015-05-23 MED ORDER — MIDAZOLAM HCL 5 MG/5ML IJ SOLN
INTRAMUSCULAR | Status: DC | PRN
  Administered 2015-05-23: 1 mg via INTRAVENOUS
  Administered 2015-05-23 (×2): 2 mg via INTRAVENOUS

## 2015-05-23 NOTE — H&P (Signed)
Stephanie Bauer is an 60 y.o. female.   Chief Complaint: Patient is here for EGD and possible esophageal variceal banding. HPI: Patient is 60 year old Caucasian female with cirrhosis secondary to NASH. Her last EGD was in December 2013 when she was found to have grade 1 esophageal varices and portal gastropathy. She is doing well. She denies melena or rectal bleeding. She has chronic thrombocytopenia. She however is not doing exercise as recommended. He is returning for EGD to assess for varices. If she has large esophageal varices these will be banded.  Past Medical History  Diagnosis Date  . Borderline diabetes   . GERD (gastroesophageal reflux disease)   . Thrombocytopenia ([place])   . Kidney calculus   . Cirrhosis ([place])     diagnosed in 2013  . Hx of melanoma excision     2013    Past Surgical History  Procedure Laterality Date  . Vaginal hysterectomy    . Appendectomy    . Lithotripsy    . Cesarean section      x2  . Colonoscopy with esophagogastroduodenoscopy (egd)  02/19/2012    Procedure: COLONOSCOPY WITH ESOPHAGOGASTRODUODENOSCOPY (EGD);  Surgeon: Rogene Houston, MD;  [facility];  Service: Endoscopy;  Laterality: N/A;  135-changed to 14:25 Ann to notify pt    Family History  Problem Relation Age of Onset  . Healthy Mother   . Diabetes Father   . Healthy Brother   . Healthy Son   . Healthy Son   . Healthy Son    Social History:  reports that she has quit smoking. She started smoking about 33 years ago. She has never used smokeless tobacco. She reports that she does not drink alcohol or use illicit drugs.  Allergies:  Allergies  Allergen Reactions  . Zantac [Ranitidine Hcl] Anaphylaxis    Medications Prior to Admission  Medication Sig Dispense Refill  . ALPRAZolam (XANAX) 0.5 MG tablet Take 0.5 mg by mouth at bedtime as needed. Sleep.    Marland Kitchen estradiol (VIVELLE-DOT) 0.05 MG/24HR Place 1 patch onto the skin. Patient changes twice a week    . fluticasone  (FLONASE) 50 MCG/ACT nasal spray Place 1 spray into both nostrils daily as needed for allergies or rhinitis.    Marland Kitchen ibuprofen (ADVIL,MOTRIN) 200 MG tablet Take 200-400 mg by mouth every 6 (six) hours as needed for moderate pain.    Marland Kitchen omeprazole (PRILOSEC) 20 MG capsule Take 20 mg by mouth daily.      No results found for this or any previous visit (from the past 48 hour(s)). No results found.  ROS  Blood pressure 142/86, pulse 64, temperature 98.1 F (36.7 C), temperature source Oral, resp. rate 18, SpO2 99 %. Physical Exam  Constitutional: She appears well-developed and well-nourished.  HENT:  Mouth/Throat: Oropharynx is clear and moist.  Eyes: Conjunctivae are normal. No scleral icterus.  Neck: No thyromegaly present.  Cardiovascular: Normal rate, regular rhythm and normal heart sounds.   No murmur heard. Respiratory: Effort normal and breath sounds normal.  GI: Soft. She exhibits no distension and no mass. There is no tenderness.  Musculoskeletal: She exhibits no edema.  Lymphadenopathy:    She has no cervical adenopathy.  Neurological: She is alert.  Skin: Skin is warm and dry.     Assessment/Plan Known cirrhosis. EGD to assess for varices and banding if esophageal varices are large.  Rogene Houston, MD 05/23/2015, 12:52 PM

## 2015-05-23 NOTE — Op Note (Signed)
[facility] Patient Name: Stephanie Bauer Procedure Date: 05/23/2015 12:50 PM MRN: [id number] Date of Birth: 08-05-1955 Attending MD: Hildred Laser , MD CSN: [id number] Age: 60 Admit Type: Outpatient Procedure:                Upper GI endoscopy Indications:              Esophageal varices Providers:                Hildred Laser, MD, Rosina Lowenstein, RN, Isabella Stalling, Technician Referring MD:              Medicines:                Cetacaine spray, Meperidine 50 mg IV, Midazolam 5                            mg IV Complications:            No immediate complications. Estimated Blood Loss:     Estimated blood loss: none. Procedure:                Pre-Anesthesia Assessment:                           - Prior to the procedure, a History and Physical                            was performed, and patient medications and                            allergies were reviewed. The patient's tolerance of                            previous anesthesia was also reviewed. The risks                            and benefits of the procedure and the sedation                            options and risks were discussed with the patient.                            All questions were answered, and informed consent                            was obtained. Prior Anticoagulants: The patient                            last took ibuprofen 7 days prior to the procedure.                            ASA Grade Assessment: II - A patient with mild  systemic disease. After reviewing the risks and                            benefits, the patient was deemed in satisfactory                            condition to undergo the procedure.                           After obtaining informed consent, the endoscope was                            passed under direct vision. Throughout the                            procedure, the patient's blood pressure, pulse, and                   oxygen saturations were monitored continuously. The                            [id number]) scope was introduced through the                            mouth, and advanced to the second part of duodenum.                            The upper GI endoscopy was accomplished without                            difficulty. The patient tolerated the procedure                            well. Scope In: 1:04:43 PM Scope Out: 1:10:40 PM Total Procedure Duration: 0 hours 5 minutes 57 seconds  Findings:      Grade I, grade II varices were found in the lower third of the       esophagus. Estimated blood loss: none.      The Z-line was regular and was found 35 cm from the incisors.      Mild portal hypertensive gastropathy was found in the cardia, in the       gastric fundus and in the gastric body.      Multiple dispersed, 3 mm non-bleeding erosions were found in the gastric       antrum. There were no stigmata of recent bleeding.      Patchy mild inflammation characterized by erythema and friability was       found in the duodenal bulb. Impression:               - Grade I and grade II esophageal varices. one                            column was grade 2 and others grade 1.                           - Z-line regular, 35  cm from the incisors.                           - Portal hypertensive gastropathy.                           - Non-bleeding erosive gastropathy.                           - Erosive duodenitis.                           - No specimens collected. Moderate Sedation:      Moderate (conscious) sedation was administered by the endoscopy nurse       and supervised by the endoscopist. The following parameters were       monitored: oxygen saturation, heart rate, blood pressure, CO2       capnography and response to care. Total physician intraservice time was       13 minutes. Recommendation:           - Patient has a contact number available for                             emergencies. The signs and symptoms of potential                            delayed complications were discussed with the                            patient. Return to normal activities tomorrow.                            Written discharge instructions were provided to the                            patient.                           - Resume previous diet today.                           - Continue present medications.                           - No aspirin, ibuprofen, naproxen, or other                            non-steroidal anti-inflammatory drugs.                           - Perform an H. pylori serology today.                           - Repeat upper endoscopy in 1 year for screening                            purposes.                           -  Return to my office in 6 months. Procedure Code(s):        --- Professional ---                           [phone], Esophagogastroduodenoscopy, flexible,                            transoral; diagnostic, including collection of                            specimen(s) by brushing or washing, when performed                            (separate procedure)                           99152, Moderate sedation services provided by the                            same physician or other qualified health care                            professional performing the diagnostic or                            therapeutic service that the sedation supports,                            requiring the presence of an independent trained                            observer to assist in the monitoring of the                            patient's level of consciousness and physiological                            status; initial 15 minutes of intraservice time,                            patient age 25 years or older Diagnosis Code(s):        --- Professional ---                           I85.00, Esophageal varices without bleeding                           K76.6,  Portal hypertension                           K31.89, Other diseases of stomach and duodenum                           K29.80, Duodenitis without bleeding CPT copyright 2016 American Medical Association. All rights reserved. The codes documented in this report are preliminary and upon coder  review may  be revised to meet current compliance requirements. Hildred Laser, MD Hildred Laser, MD 05/23/2015 1:29:15 PM This report has been signed electronically. Number of Addenda: 0

## 2015-05-23 NOTE — Discharge Instructions (Signed)
Resume usual medications but keep ibuprofen use to minimum. Resume usual diet. You must exercise at least 4 times a week and for 30 minutes each time. No driving for 24 hours. Patient will call with results of blood tests.  Esophagogastroduodenoscopy, Care After Refer to this sheet in the next few weeks. These instructions provide you with information about caring for yourself after your procedure. Your health care provider may also give you more specific instructions. Your treatment has been planned according to current medical practices, but problems sometimes occur. Call your health care provider if you have any problems or questions after your procedure. WHAT TO EXPECT AFTER THE PROCEDURE After your procedure, it is typical to feel:  Soreness in your throat.  Pain with swallowing.  Sick to your stomach (nauseous).  Bloated.  Dizzy.  Fatigued. HOME CARE INSTRUCTIONS  Do not eat or drink anything until the numbing medicine (local anesthetic) has worn off and your gag reflex has returned. You will know that the local anesthetic has worn off when you can swallow comfortably.  Do not drive or operate machinery until directed by your health care provider.  Take medicines only as directed by your health care provider. SEEK MEDICAL CARE IF:   You cannot stop coughing.  You are not urinating at all or less than usual. SEEK IMMEDIATE MEDICAL CARE IF:  You have difficulty swallowing.  You cannot eat or drink.  You have worsening throat or chest pain.  You have dizziness or lightheadedness or you faint.  You have nausea or vomiting.  You have chills.  You have a fever.  You have severe abdominal pain.  You have black, tarry, or bloody stools.   This information is not intended to replace advice given to you by your health care provider. Make sure you discuss any questions you have with your health care provider.   Document Released: 02/10/2012 Document Revised:  03/16/2014 Document Reviewed: 02/10/2012 Elsevier Interactive Patient Education [employer].

## 2015-05-24 ENCOUNTER — Other Ambulatory Visit (INDEPENDENT_AMBULATORY_CARE_PROVIDER_SITE_OTHER): Payer: Self-pay | Admitting: Internal Medicine

## 2015-05-24 LAB — H. PYLORI ANTIBODY, IGG: H PYLORI IGG: 1.5 U/mL — AB (ref 0.0–0.8)

## 2015-05-24 MED ORDER — BIS SUBCIT-METRONID-TETRACYC 140-125-125 MG PO CAPS: 3.0000 | ORAL_CAPSULE | Freq: Three times a day (TID) | ORAL | Status: DC

## 2015-05-28 ENCOUNTER — Encounter (HOSPITAL_COMMUNITY): Payer: Self-pay | Admitting: Internal Medicine

## 2015-05-30 ENCOUNTER — Telehealth (INDEPENDENT_AMBULATORY_CARE_PROVIDER_SITE_OTHER): Payer: Self-pay | Admitting: Internal Medicine

## 2015-05-30 NOTE — Telephone Encounter (Signed)
Stephanie Bauer called saying she thinks she may be having a side effect to the medication Pylera. She has diarrhea and her stool hasn't turned black yet as Dr. Laural Golden said it would. In addition, she has stomach pain above her belly button region, nausea, and a fever blister developing on her lip which she said happens when she has stomach issues. She'd like a phone call regarding this to know if she's having a reaction to the medication and if so, what she can do about it.   Pt's ph# [phone] Thank you.

## 2015-05-30 NOTE — Telephone Encounter (Signed)
Pt called back saying the fever blister is increasing in size and she usually takes L-lysine 1000mg  for it to make it go away. She's wondering if she can take on or two of these with the medication that's been prescribed to her. She'd like a phone call regarding this.  Pt's ph# [phone] Thank you.

## 2015-05-30 NOTE — Telephone Encounter (Signed)
I told her not to take extra one. She will call tomorrow with a PR report. She is having some diarrhea

## 2015-05-30 NOTE — Telephone Encounter (Signed)
Karna Christmas - would you please review and address this message and the one that she left earlier?

## 2015-06-07 ENCOUNTER — Telehealth (INDEPENDENT_AMBULATORY_CARE_PROVIDER_SITE_OTHER): Payer: Self-pay | Admitting: *Deleted

## 2015-06-07 NOTE — Telephone Encounter (Signed)
Dr Laural Golden put patient on Pylera for 10 days and today is the last day -- patient states Dr Laural Golden told her her stool would turn black but it hasn't and she wants to know if medicine worked since stool didn't turn black  Phone# [phone]

## 2015-06-07 NOTE — Telephone Encounter (Signed)
Patient was called and told that there are some patients that notices a change of color in their stool, That she should be okay.

## 2015-10-15 ENCOUNTER — Ambulatory Visit (INDEPENDENT_AMBULATORY_CARE_PROVIDER_SITE_OTHER): Payer: Managed Care, Other (non HMO) | Admitting: Internal Medicine

## 2015-10-15 ENCOUNTER — Encounter (INDEPENDENT_AMBULATORY_CARE_PROVIDER_SITE_OTHER): Payer: Self-pay | Admitting: Internal Medicine

## 2015-10-15 ENCOUNTER — Encounter (INDEPENDENT_AMBULATORY_CARE_PROVIDER_SITE_OTHER): Payer: Self-pay | Admitting: *Deleted

## 2015-10-15 VITALS — BP 140/96 | HR 68 | Temp 98.3°F | Resp 18 | Ht 60.0 in | Wt 177.6 lb

## 2015-10-15 DIAGNOSIS — K7460 Unspecified cirrhosis of liver: Secondary | ICD-10-CM

## 2015-10-15 LAB — HEPATIC FUNCTION PANEL
ALK PHOS: 102 U/L (ref 33–130)
ALT: 25 U/L (ref 6–29)
AST: 35 U/L (ref 10–35)
Albumin: 3.6 g/dL (ref 3.6–5.1)
BILIRUBIN INDIRECT: 1.1 mg/dL (ref 0.2–1.2)
BILIRUBIN TOTAL: 1.4 mg/dL — AB (ref 0.2–1.2)
Bilirubin, Direct: 0.3 mg/dL — ABNORMAL HIGH (ref <=0.2)
Total Protein: 6.9 g/dL (ref 6.1–8.1)

## 2015-10-15 NOTE — Patient Instructions (Signed)
You must exercise daily even if it is for few minutes. Goal is 30 minutes at least 4 times a week. Physician will call with results of blood tests and ultrasound when completed.

## 2015-10-15 NOTE — Progress Notes (Signed)
Presenting complaint;  Follow-up for chronic liver disease.  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 She was diagnosed with this condition in November 2013 when she was discovered to have thrombocytopenia. She has no complaints. She states she is very active but does not do schedule exercise. Since her cirrhosis was diagnosed in 2013 she has gained 11 pounds. She has good appetite. She denies nausea vomiting heartburn dysphagia abdominal pain melena or rectal bleeding. She states she has received first 2 doses of have a and B vaccination and is due for third dose. She takes no more than 1-2 tablets of Advil per month for headache or musculoskeletal pain.    Current Medications: Outpatient Encounter Prescriptions as of 10/15/2015  Medication Sig  . ALPRAZolam (XANAX) 0.5 MG tablet Take 0.5 mg by mouth at bedtime as needed. Sleep.  . fluticasone (FLONASE) 50 MCG/ACT nasal spray Place 1 spray into both nostrils daily as needed for allergies or rhinitis.  Marland Kitchen ibuprofen (ADVIL,MOTRIN) 200 MG tablet Take 200-400 mg by mouth every 6 (six) hours as needed for moderate pain.  Marland Kitchen omeprazole (PRILOSEC) 20 MG capsule Take 20 mg by mouth daily.  . promethazine (PHENERGAN) 12.5 MG tablet as needed (Kidney Stones).   . [DISCONTINUED] bismuth-metronidazole-tetracycline (PYLERA) 140-125-125 MG capsule Take 3 capsules by mouth 4 (four) times daily -  before meals and at bedtime. (Patient not taking: Reported on 10/15/2015)  . [DISCONTINUED] estradiol (VIVELLE-DOT) 0.05 MG/24HR Place 1 patch onto the skin. Patient changes twice a week   No facility-administered encounter medications on file as of 10/15/2015.      Objective: Blood pressure (!) 140/96, pulse 68, temperature 98.3 F (36.8 C), temperature source Oral, resp. rate 18, height 5' (1.524 m), weight 177 lb 9.6 oz (80.6 kg). Patient is alert  and does not have asterixis. Conjunctiva is pink. Sclera is nonicteric Oropharyngeal mucosa is normal. No neck masses or thyromegaly noted. Cardiac exam with regular rhythm normal S1 and S2. No murmur or gallop noted. Lungs are clear to auscultation. Abdomen is full but soft and nontender. Spleen is not palpable. Liver edge is firm. Shifting dullness absent.  No LE edema or clubbing noted.  Labs/studies Results: Lab data from 04/17/2015  AFP 7.9 (normal 6.1 or less )  WBC 5.4, H&H 14.3 and 41.0 and platelet count 72K. Bilirubin 1.0, AP 105, AST 31, ALT 23, total protein 6.8 and albumin 3.3.  Last ultrasound was in November 2016 with elastography. She had echogenic liver with irregular surface consistent with cirrhosis splenomegaly. Median hepatic Shearer wave velocity was 1.6 3 m/sec corresponding to fibrosis score of F2 and F3.   Assessment:  #1.Cirrhosis secondary to NASH. She remains with well preserved hepatic function. However albumin is trending downwards which is worrisome. Her disease has been complicated by thrombocytopenia. I believe her disease was underestimated with elastography. I believe she has stage IV disease. EGD in March this year revealed grade 1 and II esophageal varices not enlarged have to be banded.  #2. H pylori gastritis. Status post therapy in March 2017.  #3. Borderline AFP possibly secondary to underlying cirrhosis.    Plan:  Patient will go to the lab for LFTs and serum AFP. Right upper quadrant abdominal ultrasound. Patient informed that she must exercise at least 4 times a week if not daily. Goal is for to gradually increase time duration to 30 minutes. She can either walk briskly  or use elliptical which she has. Office visit in 6 months.

## 2015-10-16 ENCOUNTER — Ambulatory Visit (HOSPITAL_COMMUNITY)
Admission: RE | Admit: 2015-10-16 | Discharge: 2015-10-16 | Disposition: A | Discharge status: Home / Self Care | Payer: Managed Care, Other (non HMO) | Source: Ambulatory Visit | Attending: Internal Medicine | Admitting: Internal Medicine

## 2015-10-16 DIAGNOSIS — K7460 Unspecified cirrhosis of liver: Secondary | ICD-10-CM | POA: Diagnosis not present

## 2015-10-16 DIAGNOSIS — N200 Calculus of kidney: Secondary | ICD-10-CM | POA: Diagnosis not present

## 2015-10-16 LAB — AFP TUMOR MARKER: AFP-Tumor Marker: 7.8 ng/mL — ABNORMAL HIGH (ref <6.1)

## 2015-10-21 ENCOUNTER — Other Ambulatory Visit (INDEPENDENT_AMBULATORY_CARE_PROVIDER_SITE_OTHER): Payer: Self-pay | Admitting: *Deleted

## 2015-10-21 DIAGNOSIS — K7460 Unspecified cirrhosis of liver: Secondary | ICD-10-CM

## 2016-04-02 ENCOUNTER — Encounter (INDEPENDENT_AMBULATORY_CARE_PROVIDER_SITE_OTHER): Payer: Self-pay | Admitting: *Deleted

## 2016-04-02 ENCOUNTER — Other Ambulatory Visit (INDEPENDENT_AMBULATORY_CARE_PROVIDER_SITE_OTHER): Payer: Self-pay | Admitting: *Deleted

## 2016-04-02 DIAGNOSIS — K7460 Unspecified cirrhosis of liver: Secondary | ICD-10-CM

## 2016-04-07 ENCOUNTER — Encounter (INDEPENDENT_AMBULATORY_CARE_PROVIDER_SITE_OTHER): Payer: Self-pay | Admitting: *Deleted

## 2016-04-21 ENCOUNTER — Ambulatory Visit (INDEPENDENT_AMBULATORY_CARE_PROVIDER_SITE_OTHER): Payer: Managed Care, Other (non HMO) | Admitting: Internal Medicine

## 2016-05-15 ENCOUNTER — Encounter (INDEPENDENT_AMBULATORY_CARE_PROVIDER_SITE_OTHER): Payer: Self-pay | Admitting: *Deleted

## 2017-10-19 IMAGING — US US ABDOMEN COMPLETE W/ ELASTOGRAPHY
2 series · 12 of 25 positions shown · non-contrast
Comparison: 01/09/2014 CT abdomen/pelvis.

CLINICAL DATA: Cirrhosis.  Elevated AFP.



[Series 1: us abdomen complete w/ elastography · 0.19mm/px · 11 of 118 slices shown (1 of 2)]
[im 6/118]
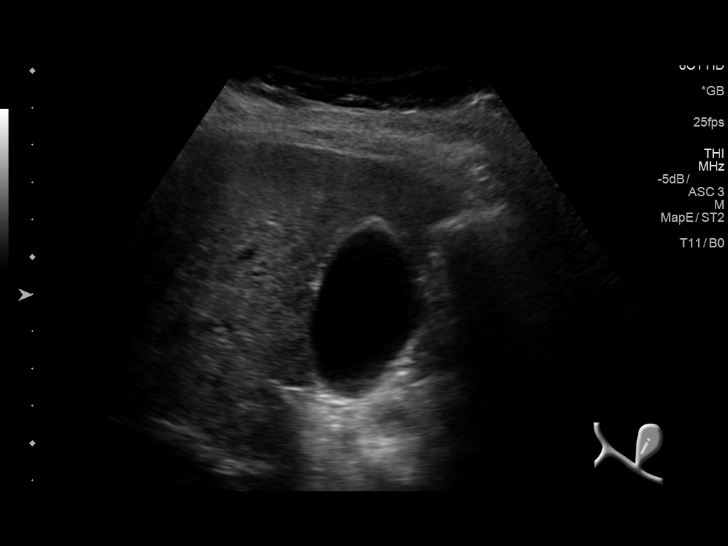
[im 17/118]
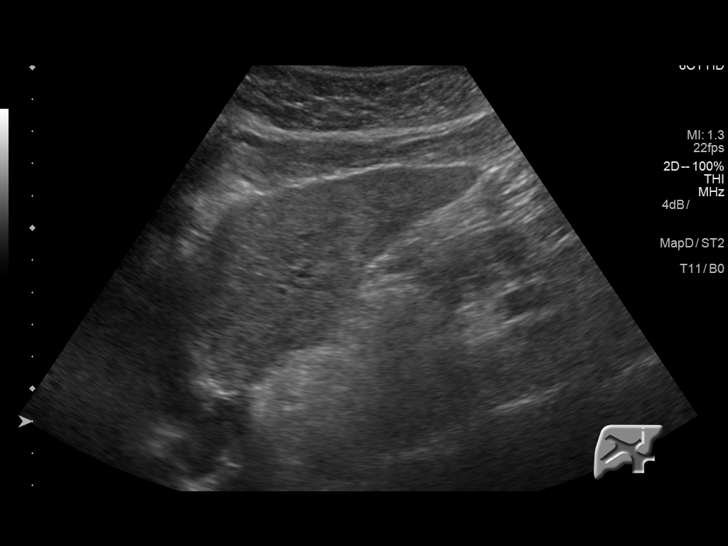
[im 28/118]
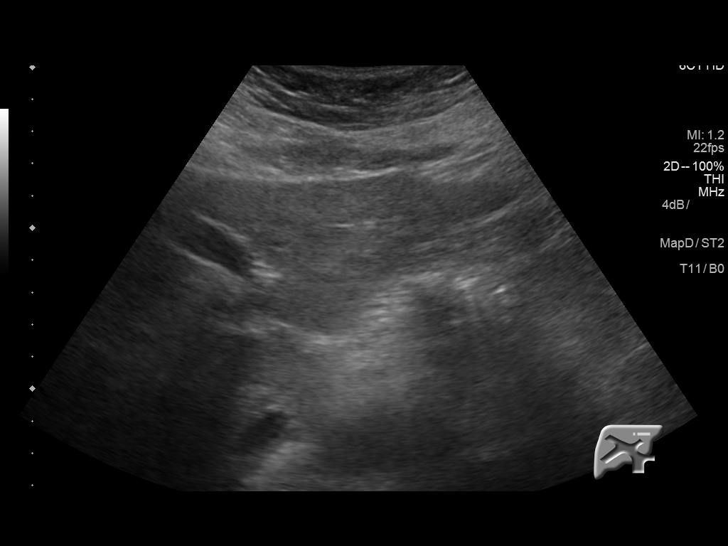
[im 40/118]
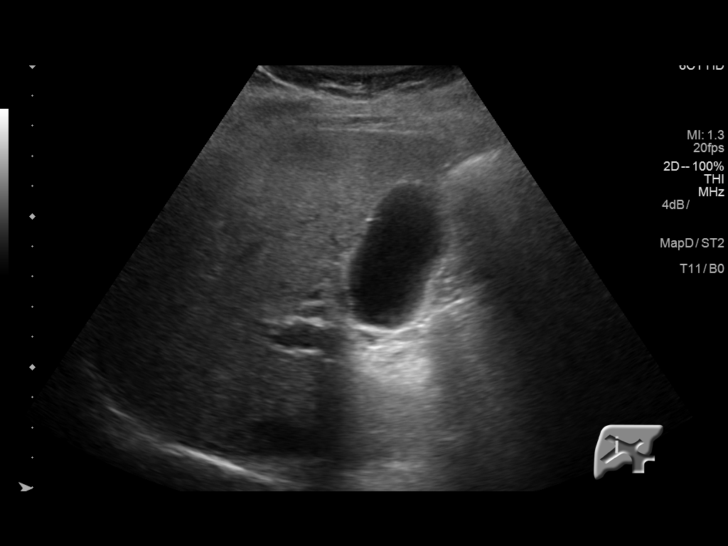
[im 51/118]
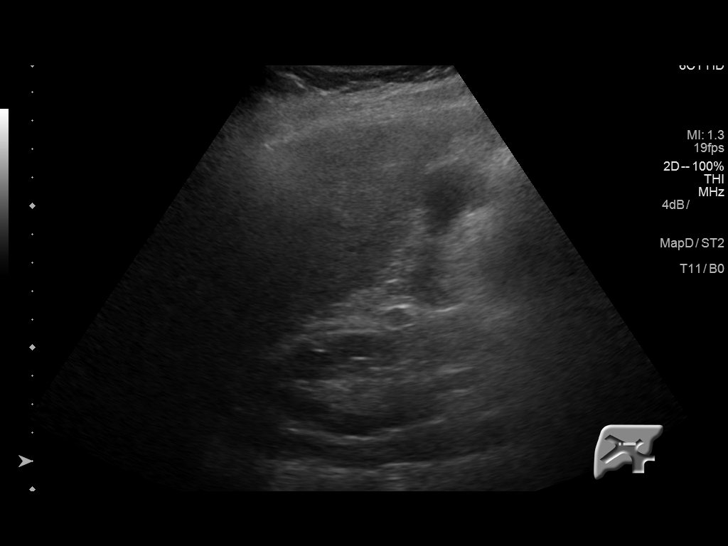
[im 62/118]
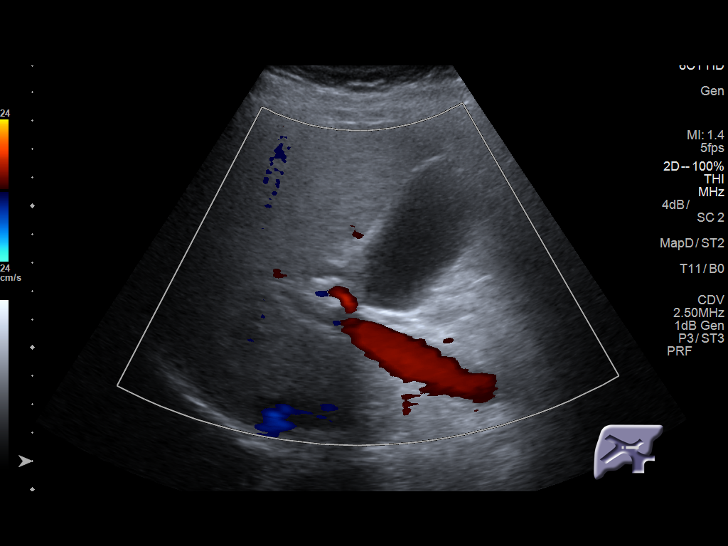
[im 73/118]
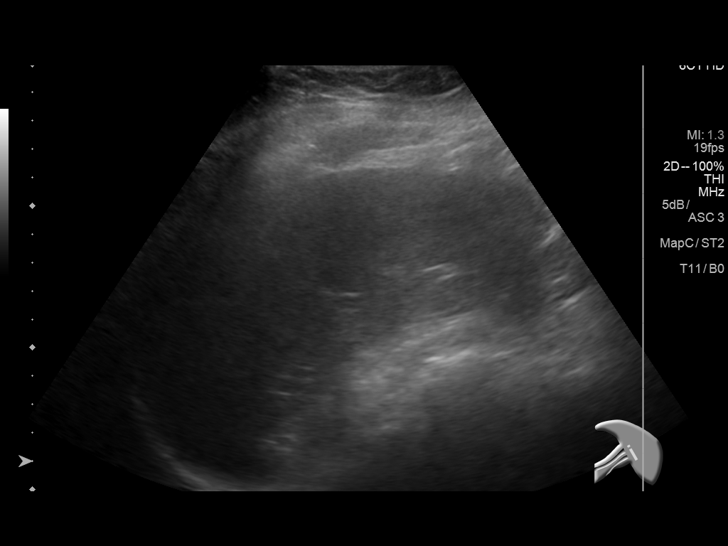
[im 84/118]
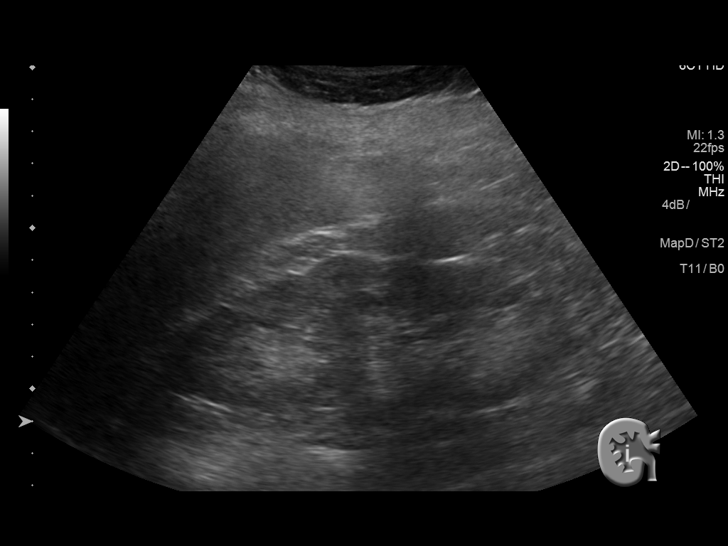
[im 95/118]
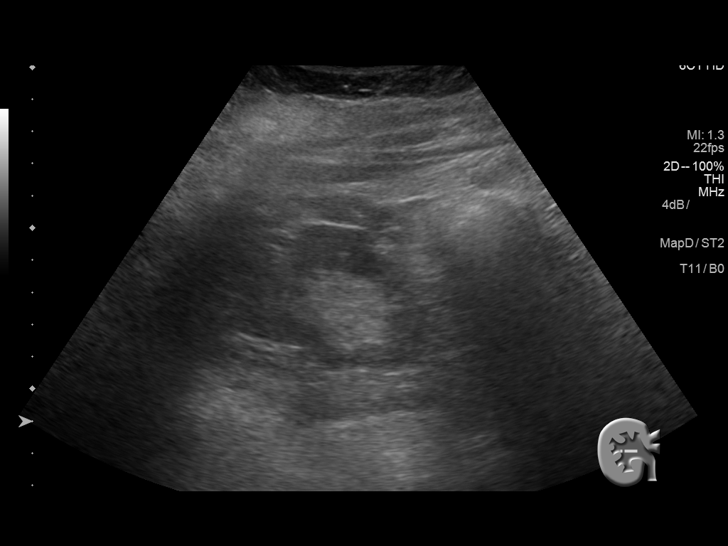
[im 106/118]
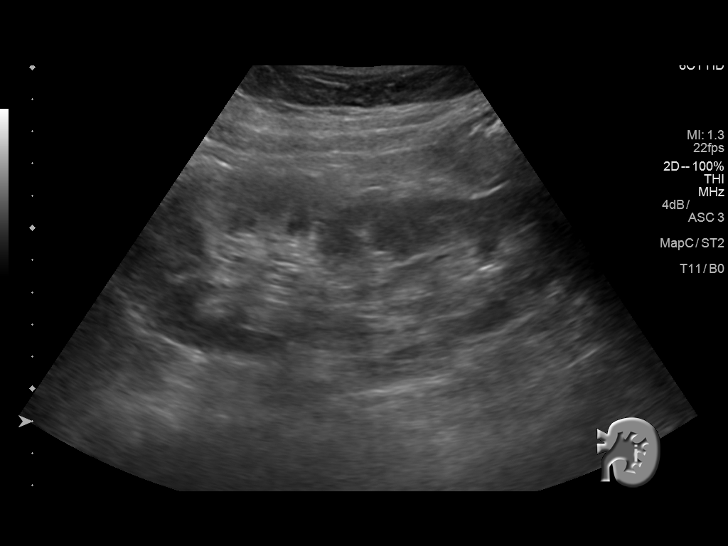
[im 118/118]
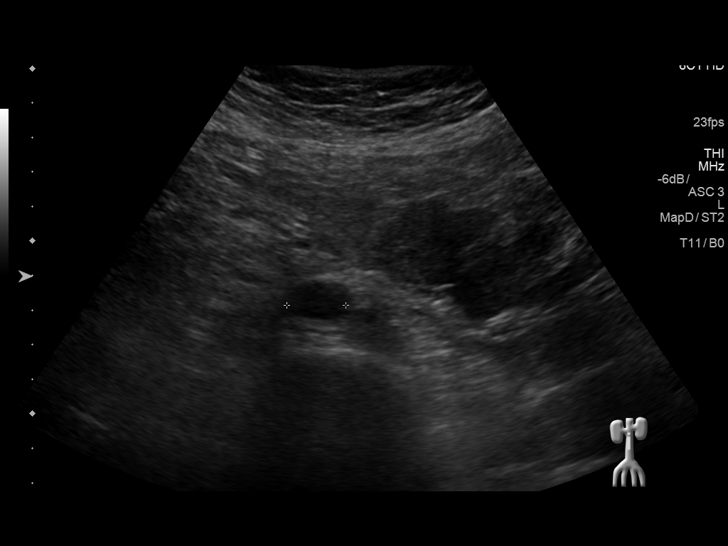

[Series 2001: us abdomen complete w/ elastography · 0.13mm/px · 1 of 15 slices shown (2 of 2)]
[im 8/15]
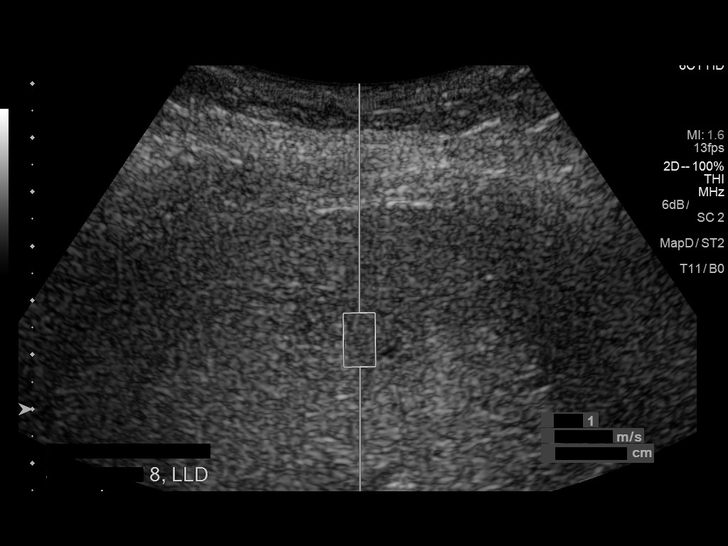

[12 of 25 positions shown; findings below may reference images not displayed]

FINDINGS: ULTRASOUND ABDOMEN

Gallbladder: No gallstones or wall thickening visualized. No
sonographic Murphy sign noted.

Common bile duct: Diameter: 4 mm

Liver: Liver surface is diffusely irregular in the liver parenchyma
is coarsened in echotexture, in keeping with cirrhosis. Liver
parenchyma is diffusely moderately echogenic with posterior acoustic
attenuation, in keeping with superimposed moderate diffuse hepatic
steatosis. No liver mass is detected, noting limited sensitivity in
the setting of an echogenic liver.

IVC: No abnormality visualized.

Pancreas: Visualized portion unremarkable.

Spleen: Moderate splenomegaly (craniocaudal splenic length 15.6 cm).
Splenic volume approximately 839 cc (15.6 x 14.9 x 6.9 cm). No
splenic mass.

Right Kidney: Length: 13.4 cm. Echogenicity within normal limits. No
mass or hydronephrosis visualized.

Left Kidney: Length: 14.9 cm. Echogenicity within normal limits. No
mass or hydronephrosis visualized.

Abdominal aorta: No aneurysm visualized.

Other findings: None.  No upper abdominal ascites.

ULTRASOUND HEPATIC ELASTOGRAPHY

Device: Siemens Helix VTQ

Transducer 6C1

Patient position: Left lateral decubitus

Number of measurements:  10

Hepatic Segment:  8

Median velocity:   1.63  m/sec

IQR:

IQR/Median velocity ratio

Corresponding Metavir fibrosis score:  F2 + some F3

Risk of fibrosis: Moderate

Limitations of exam: None

Pertinent findings noted on other imaging exams:  None

Please note that abnormal shear wave velocities may also be
identified in clinical settings other than with hepatic fibrosis,
such as: acute hepatitis, elevated right heart and central venous
pressures including use of beta blockers, Mukherjee disease
(Katzman), infiltrative processes such as
mastocytosis/amyloidosis/infiltrative tumor, extrahepatic
cholestasis, in the post-prandial state, and liver transplantation.
Correlation with patient history, laboratory data, and clinical
condition recommended.
IMPRESSION: 1. Cirrhosis and moderate diffuse hepatic steatosis. No liver mass
detected.
2. Moderate splenomegaly.  No upper abdominal ascites.
3. Otherwise normal abdominal sonogram.
4. Hepatic elastography results:

Median hepatic shear wave velocity is calculated at 1.63 m/sec.

Corresponding Metavir fibrosis score is F2 + some F3.

Risk of fibrosis is moderate.

Follow-up:  Additional testing appropriate.

## 2018-07-14 IMAGING — US US ABDOMEN LIMITED
1 series · 14 of 25 positions shown · non-contrast
Comparison: CT 03/21/2015 .

CLINICAL DATA: Cirrhosis.

EXAM:
US ABDOMEN LIMITED - RIGHT UPPER QUADRANT

[Series 1: us abdomen limited · 0.24mm/px · 14 of 45 slices shown]
[im 1/45]
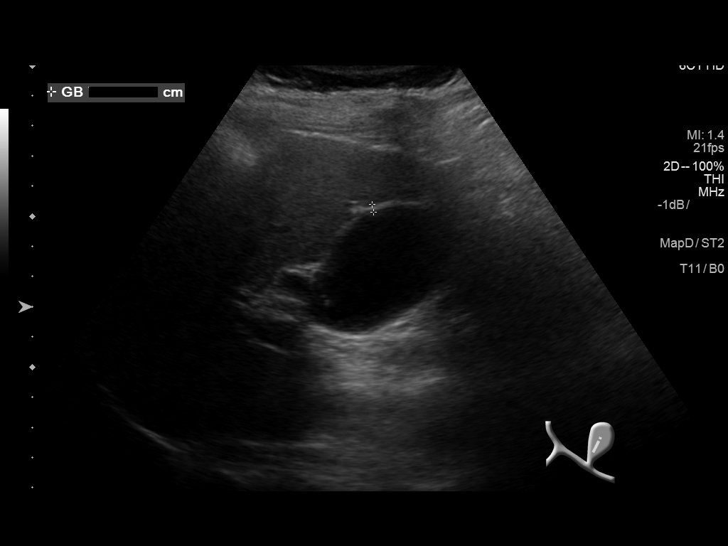
[im 4/45]
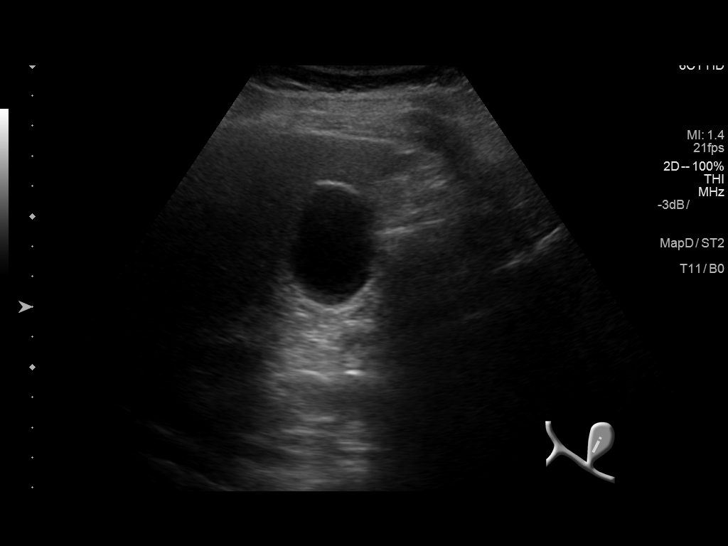
[im 8/45]
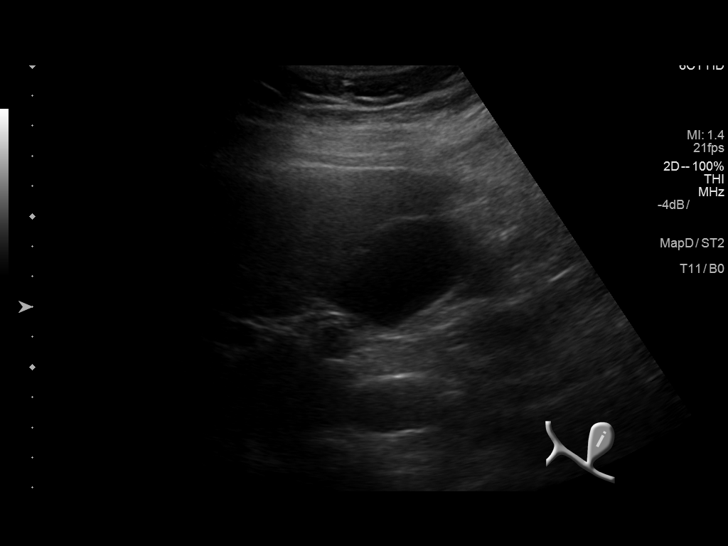
[im 12/45]
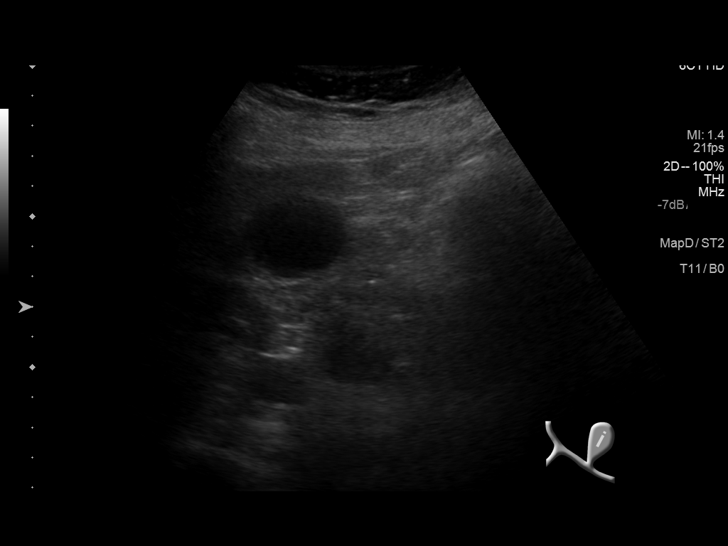
[im 15/45]
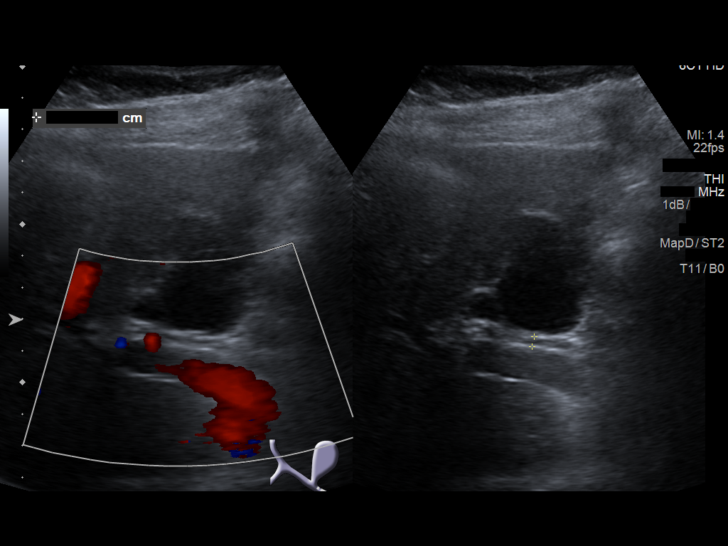
[im 17/45]
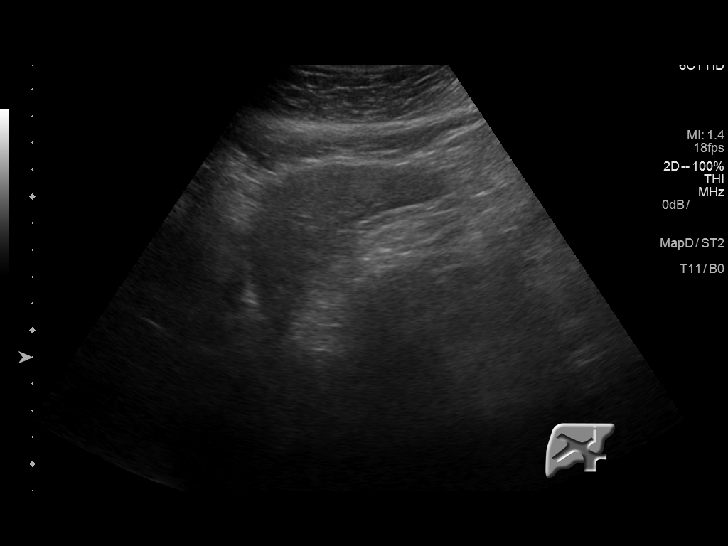
[im 21/45]
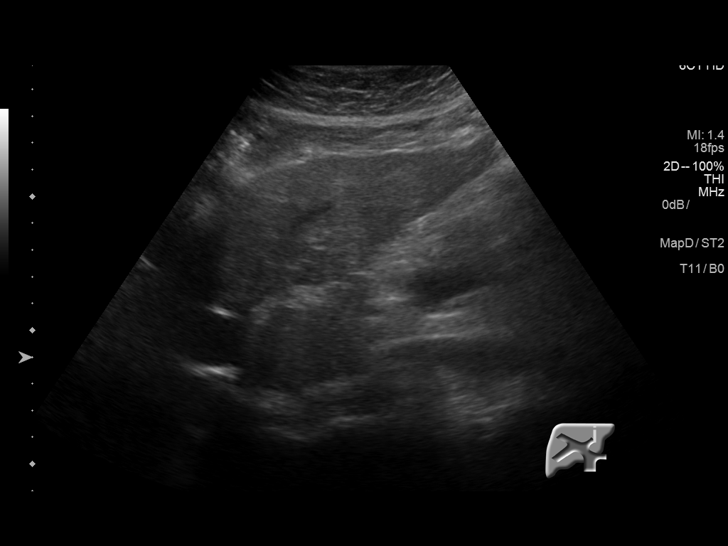
[im 24/45]
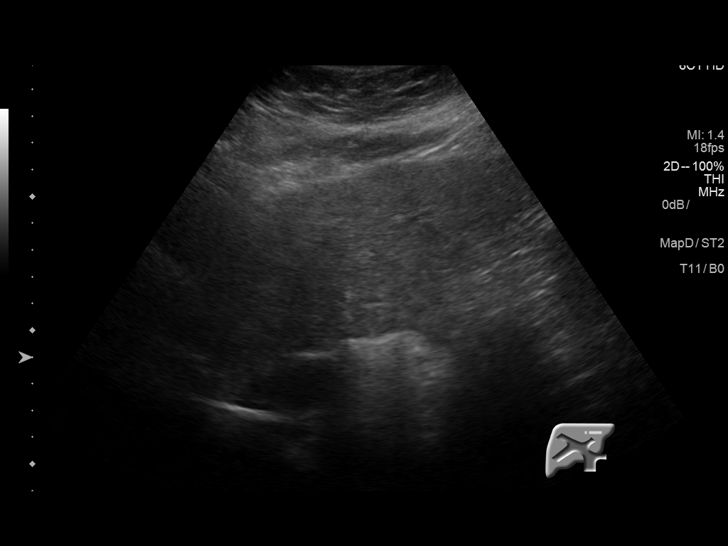
[im 28/45]
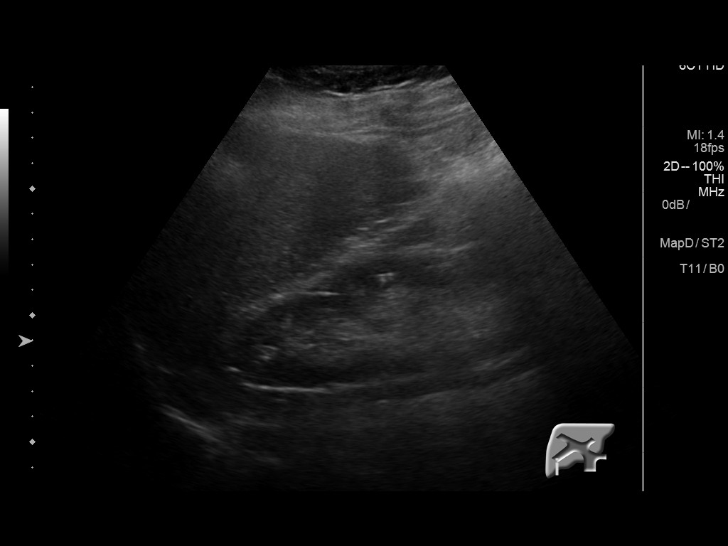
[im 30/45]
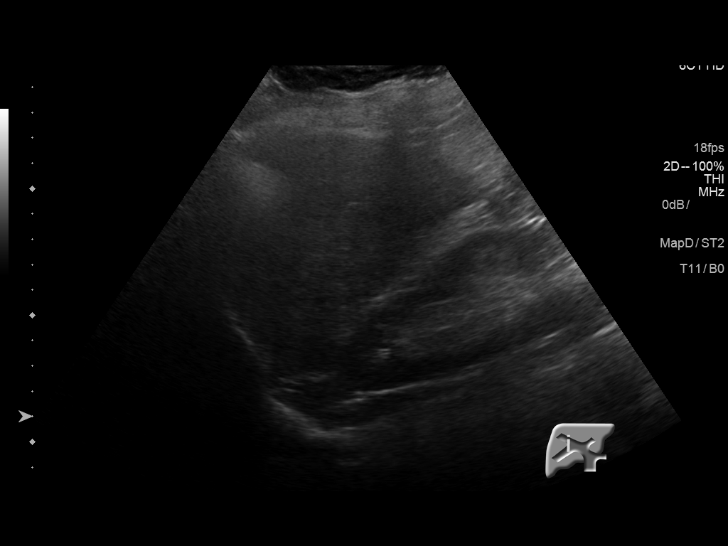
[im 34/45]
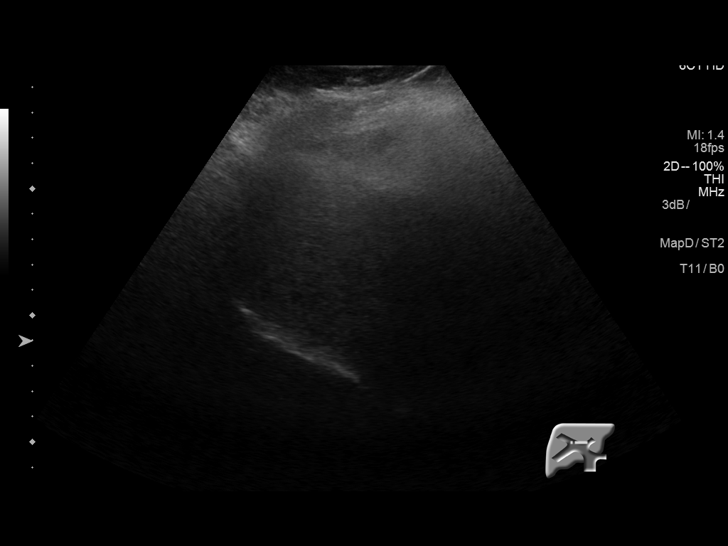
[im 37/45]
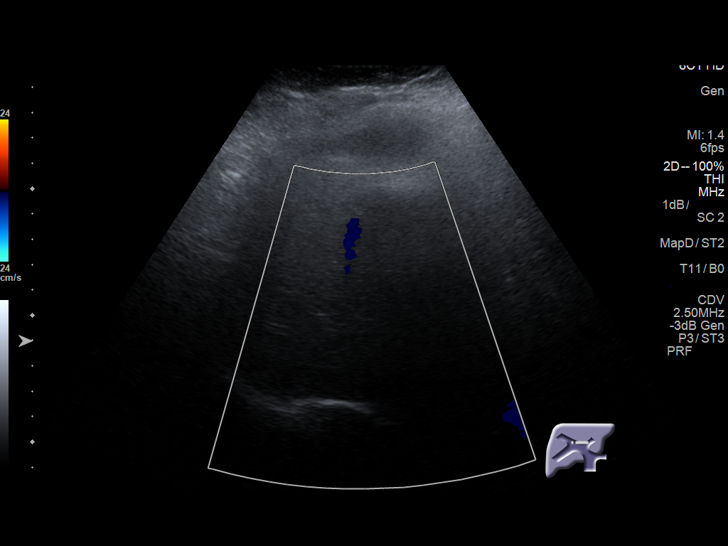
[im 41/45]
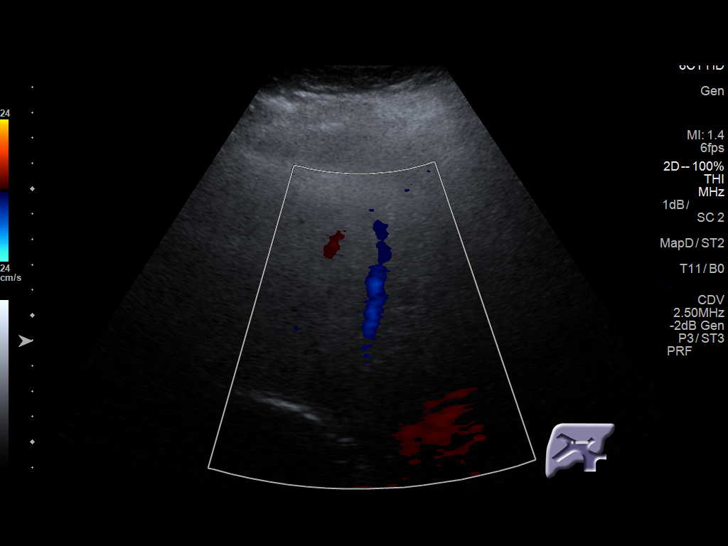
[im 45/45]
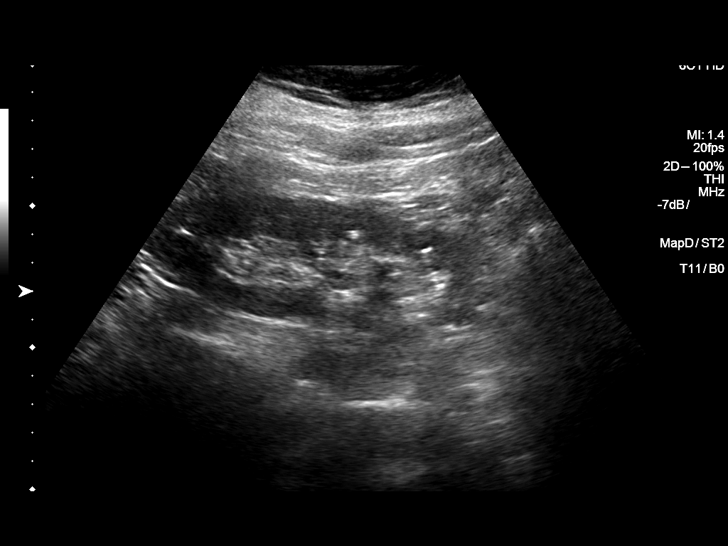

[14 of 25 positions shown; findings below may reference images not displayed]

FINDINGS: Gallbladder:

No gallstones or wall thickening visualized. No sonographic Murphy
sign noted by sonographer.

Common bile duct:

Diameter: 5 mm

Liver:

Liver is echogenic consistent fatty infiltration and/or
hepatocellular disease. Incidental note is made of a nonobstructing
6 mm right renal calyceal stone.
IMPRESSION: 1. Liver is echogenic consistent fatty infiltration and/or
hepatocellular disease. No gallstones or biliary distention.

2. Incidental note made of nonobstructing 6 mm right renal calyceal
stone.

## 2019-08-28 ENCOUNTER — Telehealth: Payer: Self-pay | Admitting: Physician Assistant

## 2019-08-28 NOTE — Telephone Encounter (Signed)
Referral from Tillman Abide, FNP, Dr. Mendel Corning office. Appt. 7/16 @11 :30. Here ? Years ago. (Check last DOS)

## 2019-09-13 ENCOUNTER — Encounter: Payer: Self-pay | Admitting: *Deleted

## 2019-09-22 ENCOUNTER — Encounter: Payer: Self-pay | Admitting: Physician Assistant

## 2019-09-22 ENCOUNTER — Other Ambulatory Visit: Payer: Self-pay

## 2019-09-22 ENCOUNTER — Ambulatory Visit: Payer: BC Managed Care – PPO | Admitting: Physician Assistant

## 2019-09-22 DIAGNOSIS — L281 Prurigo nodularis: Secondary | ICD-10-CM

## 2019-09-22 DIAGNOSIS — D485 Neoplasm of uncertain behavior of skin: Secondary | ICD-10-CM

## 2019-09-22 DIAGNOSIS — Z86006 Personal history of melanoma in-situ: Secondary | ICD-10-CM

## 2019-09-22 DIAGNOSIS — Z1283 Encounter for screening for malignant neoplasm of skin: Secondary | ICD-10-CM

## 2019-09-22 MED ORDER — CLOBETASOL PROPIONATE 0.05 % EX OINT: 1.0000 "application " | TOPICAL_OINTMENT | Freq: Two times a day (BID) | CUTANEOUS | 0 refills | Status: DC

## 2019-09-22 MED ORDER — TRIAMCINOLONE ACETONIDE 0.1 % EX CREA: 1.0000 "application " | TOPICAL_CREAM | Freq: Two times a day (BID) | CUTANEOUS | 0 refills | Status: DC

## 2019-09-22 NOTE — Patient Instructions (Signed)

## 2019-09-22 NOTE — Progress Notes (Signed)
   Follow-Up Visit   Subjective  Stephanie Bauer is a 64 y.o. female who presents for the following: Rash (x 8 months- left & right lowr leg- no itch, no bleed- tx PCP- steroid shot,  TAC Cream-  " helps").   The following portions of the chart were reviewed this encounter and updated as appropriate: Tobacco  Allergies  Meds  Problems  Med Hx  Surg Hx  Fam Hx      Objective  Well appearing patient in no apparent distress; mood and affect are within normal limits.  A full examination was performed including scalp, head, eyes, ears, nose, lips, neck, chest, axillae, abdomen, back, buttocks, bilateral upper extremities, bilateral lower extremities, hands, feet, fingers, toes, fingernails, and toenails. All findings within normal limits unless otherwise noted below.  Objective  Head - to toe: No atypical nevi No signs of non-mole skin cancer.   Objective  Left lat shin: Thick scale     Objective  Left Lower Leg - Anterior (2), Right Lower Leg - Anterior (3): Thick pink plaques with excoriations  Objective  Neck - Anterior: Scar clear  Assessment & Plan  Screening exam for skin cancer Head - to toe  Yearly skin exams  Neoplasm of uncertain behavior of skin Left lat shin  Skin / nail biopsy Type of biopsy: tangential   Informed consent: discussed and consent obtained   Timeout: patient name, date of birth, surgical site, and procedure verified   Procedure prep:  Patient was prepped and draped in usual sterile fashion Prep type:  Isopropyl alcohol Anesthesia: the lesion was anesthetized in a standard fashion   Anesthetic:  1% lidocaine w/ epinephrine 1-100,000 buffered w/ 8.4% NaHCO3 Instrument used: flexible razor blade   Hemostasis achieved with: pressure, aluminum chloride and electrodesiccation   Outcome: patient tolerated procedure well   Post-procedure details: sterile dressing applied and wound care instructions given   Dressing type: bandage and  petrolatum    Specimen 1 - Surgical pathology Differential Diagnosis: R/O BCC vs Prurigo Nodule Check Margins: No  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); Right Lower Leg - Anterior (3)  Destruction of lesion - Left Lower Leg - Anterior (2), Right Lower Leg - Anterior (3) Complexity: simple   Destruction method: cryotherapy   Informed consent: discussed and consent obtained   Timeout:  patient name, date of birth, surgical site, and procedure verified Lesion destroyed using liquid nitrogen: Yes   Cryotherapy cycles:  1 Outcome: patient tolerated procedure well with no complications   Post-procedure details: wound care instructions given    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  The documentation on 09/22/19 for the exam, diagnosis, procedures and orders are all accurate and complete.

## 2019-09-28 ENCOUNTER — Telehealth: Payer: Self-pay | Admitting: Physician Assistant

## 2019-09-28 NOTE — Telephone Encounter (Signed)
Saw KRS; results

## 2019-09-29 ENCOUNTER — Telehealth: Payer: Self-pay | Admitting: *Deleted

## 2019-09-29 NOTE — Telephone Encounter (Signed)
Pathology report to patient.

## 2019-09-29 NOTE — Telephone Encounter (Signed)
Patient left message on office voice mail saying that she was calling to get results of biopsy from last visit and she was not able to view results in [place].

## 2020-02-15 ENCOUNTER — Other Ambulatory Visit: Payer: Self-pay | Admitting: Physician Assistant

## 2020-02-15 DIAGNOSIS — L281 Prurigo nodularis: Secondary | ICD-10-CM

## 2020-03-23 ENCOUNTER — Other Ambulatory Visit: Payer: Self-pay | Admitting: Physician Assistant

## 2020-03-31 ENCOUNTER — Other Ambulatory Visit: Payer: Self-pay | Admitting: Dermatology

## 2020-11-04 ENCOUNTER — Other Ambulatory Visit (HOSPITAL_COMMUNITY): Payer: Self-pay | Admitting: Gastroenterology

## 2020-11-04 ENCOUNTER — Other Ambulatory Visit: Payer: Self-pay | Admitting: Gastroenterology

## 2020-11-04 DIAGNOSIS — K7460 Unspecified cirrhosis of liver: Secondary | ICD-10-CM

## 2020-11-08 ENCOUNTER — Ambulatory Visit (HOSPITAL_COMMUNITY)
Admission: RE | Admit: 2020-11-08 | Discharge: 2020-11-08 | Disposition: A | Discharge status: Home / Self Care | Payer: BC Managed Care – PPO | Source: Ambulatory Visit | Attending: Gastroenterology | Admitting: Gastroenterology

## 2020-11-08 ENCOUNTER — Other Ambulatory Visit: Payer: Self-pay

## 2020-11-08 DIAGNOSIS — K7460 Unspecified cirrhosis of liver: Secondary | ICD-10-CM | POA: Diagnosis present

## 2020-11-08 DIAGNOSIS — K7581 Nonalcoholic steatohepatitis (NASH): Secondary | ICD-10-CM | POA: Diagnosis not present

## 2022-01-14 ENCOUNTER — Encounter: Payer: Self-pay | Admitting: Neurology

## 2022-02-23 NOTE — Progress Notes (Unsigned)
Assessment/Plan:    Parkinsons Disease Patient has seen multiple neurologist now.  She started with [facility] neurology, went to [facility] neurology for 1 visit, then [facility] neurology for 1 visit and now here.  Discussed with patient that she really needs to stick with 1 neurology group. I agree with the diagnosis of Parkinson's disease.  Patient felt that the levodopa made her slow down previously, but discussed with patient that would be incredibly unusual.  In addition, she likely had liver enzymes elevating at that time, confusing the situation.  Discussed with patient that all patients will eventually need levodopa at some time, the course of their disease, and it is currently recommended early on in the disease course, and now it is first-line treatment.  2.  NASH  -Patient has had clinical hepatic encephalopathy with admission to [facility] in October, 2023.  Patients ammonia was 131.  She was placed on lactulose.  3.  Pancytopenia with severe thrombocytopenia  -When patient was in the hospital with hepatic encephalopathy, platelets were down to 29.  This has been a chronic issue.  For as far back as I can see (March, 2022, her platelets have been down into the 30s).  4.  THC use  -Recommended discontinuing.  Subjective:   Stephanie Bauer was seen today in the movement disorders clinic for neurologic consultation at the request of Sarina Ser., MD.  The consultation is for the evaluation of Parkinson's disease.  Patient has already been to [facility] [address] neurology, [facility] and seen Dr. Buck Mam (consultation Aug 01, 2021) and has also seen Dr. Noberto Retort.  Notes are reviewed.  Each of these was one-time visits.  Patient's symptoms developed in February or March 2023.  First symptom was bilateral hand tremor.  She was first seen in [place], [place] and given primidone and patient felt cognitively dull.  She then sought consultation with Dr. Buck Mam and was  diagnosed with Parkinson's disease in May, 2023.  It was recommended that she start carbidopa/levodopa 25/100, 1 tablet 3 times per day and work up to 2 tablets 3 times per day.  Patient called back a few days later to Dr. Normajean Baxter office stating that she looked up side effects of levodopa and worried about dry mouth and decided to hold off on taking levodopa until she got her back pain straightened out.  She then called Dr. Buck Mam at the end of July stating that she started levodopa 1 week prior at 1 tablet once per day and it made her writing bad, movement slow and shaking worse.  She was told it was okay to stay off the medication.  About 2 months later, she presented to the emergency room at [facility] with mental status change and tremulous movement.  Patient's ammonia was markedly elevated at 131 (went up to 187).  Drug screen was positive for THC.  Patient was diagnosed with NASH, hepatic encephalopathy and was placed on lactulose.  She was also noted to have pancytopenia and her platelets were down to 29 with evidence of active bleeding.  She did see Dr. Noberto Retort November 7   Specific Symptoms:  Tremor: {yes no:314532} Family hx of similar:  {yes no:314532} two ***uncles with Parkinson's disease Voice: *** Sleep: ***  Vivid Dreams:  {yes no:314532}  Acting out dreams:  {yes no:314532} Wet Pillows: {yes no:314532} Postural symptoms:  {yes no:314532}  Falls?  {yes no:314532} Bradykinesia symptoms: {parkinson brady:18041} Loss of smell:  {yes no:314532} Loss of taste:  {yes no:314532}  Urinary Incontinence:  {yes no:314532} Difficulty Swallowing:  {yes no:314532} Handwriting, micrographia: {yes no:314532} Trouble with ADL's:  {yes no:314532}  Trouble buttoning clothing: {yes no:314532} Depression:  {yes no:314532} Memory changes:  {yes no:314532} Hallucinations:  {yes no:314532}  visual distortions: {yes no:314532} N/V:  {yes no:314532} Lightheaded:  {yes no:314532}  Syncope: {yes  no:314532} Diplopia:  {yes no:314532} Dyskinesia:  {yes no:314532} Prior exposure to reglan/antipsychotics: {yes no:314532}  Neuroimaging of the brain has *** previously been performed.  It *** available for my review today.  PREVIOUS MEDICATIONS: {Parkinson's RX:18200}  ALLERGIES:   Allergies  Allergen Reactions   Zantac [Ranitidine Hcl] Anaphylaxis    CURRENT MEDICATIONS:  No outpatient medications have been marked as taking for the 02/25/22 encounter (Appointment) with Loel Betancur, Eustace Quail, DO.     Objective:   VITALS:  There were no vitals filed for this visit.  GEN:  The patient appears stated age and is in NAD. HEENT:  Normocephalic, atraumatic.  The mucous membranes are moist. The superficial temporal arteries are without ropiness or tenderness. CV:  RRR Lungs:  CTAB Neck/HEME:  There are no carotid bruits bilaterally.  Neurological examination:  Orientation: The patient is alert and oriented x3.  Cranial nerves: There is good facial symmetry. Extraocular muscles are intact. The visual fields are full to confrontational testing. The speech is fluent and clear. Soft palate rises symmetrically and there is no tongue deviation. Hearing is intact to conversational tone. Sensation: Sensation is intact to light and pinprick throughout (facial, trunk, extremities). Vibration is intact at the bilateral big toe. There is no extinction with double simultaneous stimulation. There is no sensory dermatomal level identified. Motor: Strength is 5/5 in the bilateral upper and lower extremities.   Shoulder shrug is equal and symmetric.  There is no pronator drift. Deep tendon reflexes: Deep tendon reflexes are 2/4 at the bilateral biceps, triceps, brachioradialis, patella and achilles. Plantar responses are downgoing bilaterally.  Movement examination: Tone: There is ***tone in the bilateral upper extremities.  The tone in the lower extremities is ***.  Abnormal movements: *** Coordination:   There is *** decremation with RAM's, *** Gait and Station: The patient has *** difficulty arising out of a deep-seated chair without the use of the hands. The patient's stride length is ***.  The patient has a *** pull test.     I have reviewed and interpreted the following labs independently   Chemistry      Component Value Date/Time   NA 141 07/11/2012 0955   K 3.9 07/11/2012 0955   CL 102 07/11/2012 0955   CO2 24 07/11/2012 0955   BUN 17 07/11/2012 0955   CREATININE 0.69 07/11/2012 0955      Component Value Date/Time   CALCIUM 9.2 07/11/2012 0955   ALKPHOS 102 10/15/2015 1059   AST 35 10/15/2015 1059   ALT 25 10/15/2015 1059   BILITOT 1.4 (H) 10/15/2015 1059      No results found for: "TSH" Lab Results  Component Value Date   WBC 5.4 04/17/2015   HGB 14.3 04/17/2015   HCT 41.0 04/17/2015   MCV 88.9 04/17/2015   PLT 72 (L) 04/17/2015     Total time spent on today's visit was ***60 minutes, including both face-to-face time and nonface-to-face time.  Time included that spent on review of records (prior notes available to me/labs/imaging if pertinent), discussing treatment and goals, answering patient's questions and coordinating care.  Cc:  Emelda Fear, DO

## 2022-02-25 ENCOUNTER — Ambulatory Visit: Payer: BLUE CROSS/BLUE SHIELD | Admitting: Neurology

## 2022-02-25 ENCOUNTER — Encounter: Payer: Self-pay | Admitting: Neurology

## 2022-02-25 VITALS — BP 120/77 | HR 69 | Ht 60.0 in | Wt 181.2 lb

## 2022-02-25 DIAGNOSIS — D61818 Other pancytopenia: Secondary | ICD-10-CM

## 2022-02-25 DIAGNOSIS — R251 Tremor, unspecified: Secondary | ICD-10-CM | POA: Diagnosis not present

## 2022-02-25 DIAGNOSIS — K7581 Nonalcoholic steatohepatitis (NASH): Secondary | ICD-10-CM | POA: Diagnosis not present

## 2022-02-25 DIAGNOSIS — G20C Parkinsonism, unspecified: Secondary | ICD-10-CM

## 2022-02-25 NOTE — Patient Instructions (Signed)

## 2022-04-01 ENCOUNTER — Encounter (HOSPITAL_COMMUNITY)
Admission: RE | Admit: 2022-04-01 | Discharge: 2022-04-01 | Disposition: A | Discharge status: Home / Self Care | Payer: BLUE CROSS/BLUE SHIELD | Source: Ambulatory Visit | Attending: Neurology | Admitting: Neurology

## 2022-04-01 DIAGNOSIS — R251 Tremor, unspecified: Secondary | ICD-10-CM | POA: Diagnosis not present

## 2022-04-01 MED ORDER — POTASSIUM IODIDE (ANTIDOTE) 130 MG PO TABS: 130.0000 mg | ORAL_TABLET | Freq: Once | ORAL | Status: DC

## 2022-04-01 MED ORDER — POTASSIUM IODIDE (ANTIDOTE) 130 MG PO TABS
ORAL_TABLET | ORAL | Status: AC
  Administered 2022-04-01: 130 mg via ORAL
  Filled 2022-04-01: qty 1

## 2022-04-01 MED ORDER — IOFLUPANE I 123 185 MBQ/2.5ML IV SOLN
4.7000 | Freq: Once | INTRAVENOUS | Status: AC | PRN
  Administered 2022-04-01: 4.7 via INTRAVENOUS
  Filled 2022-04-01: qty 5

## 2022-05-01 NOTE — Progress Notes (Unsigned)
    Assessment/Plan:   1.  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  -She was previously given levodopa and did not tolerate it.  I do not think she needs it.  She does not meet [place] brain bank criteria for the diagnosis of Parkinsons disease.  -Nature of functional tremor discussed in detail and discussed fndhope website.  -Patient under a lot of family stress with children.  -Was agreeable to referring to counseling for cognitive behavioral therapy.  -could do skin bx but would defer given low platelets and think that risk>benefits.  She agrees.  -Told her I would follow her back up in the next 8-9 months to see how she is doing and make sure that she is not developing anything else, since another movement disorder neurologist did not think the diagnosis was Parkinson's disease.     Subjective:   Stephanie Bauer was seen today in follow up for tremor.  My previous records were reviewed prior to todays visit as well as outside records available to me.  When I saw the patient last visit, the patient did not meet criteria for Parkinsons disease.  There were some functional aspects.  She had a DaTscan done since our last visit and this was normal.  I personally reviewed the scan.   ALLERGIES:   Allergies  Allergen Reactions   Zantac [Ranitidine Hcl] Anaphylaxis    CURRENT MEDICATIONS:  No outpatient medications have been marked as taking for the 05/04/22 encounter (Office Visit) with Takoda Janowiak, Eustace Quail, DO.     Objective:   PHYSICAL EXAMINATION:    VITALS:   Vitals:   05/04/22 1403  BP: (!) 143/83  Pulse: 62  SpO2: 97%  Weight: 180 lb (81.6 kg)  Height: 5' (1.524 m)    GEN:  The patient appears stated age and is in NAD. HEENT:  Normocephalic, atraumatic.  The mucous membranes are moist. The superficial temporal arteries are without ropiness or tenderness. CV:  RRR Lungs:  CTAB Neck/HEME:  There are no carotid bruits bilaterally.  Neurological  examination:  Orientation: The patient is alert and oriented x3. Cranial nerves: There is good facial symmetry without facial hypomimia. The speech is fluent and clear. Soft palate rises symmetrically and there is no tongue deviation. Hearing is intact to conversational tone. Sensation: Sensation is intact to light touch throughout Motor: Strength is at least antigravity x4.  Movement examination: Tone: There is normal tone in the bilateral upper extremities.  The tone in the lower extremities is normal.  Abnormal movements: No rest tremor noted today.  There is a postural tremor that is irregular and has some entrainment.  This is mostly on the left.   Coordination:  There is no decremation with RAM's, with any form of RAMS, including alternating supination and pronation of the forearm, hand opening and closing, finger taps, heel taps and toe taps. Gait and Station: I watched the patient ambulate from the scale to the exam room when she was unaware I was watching.  She ambulated well, with good armswing, without resting tremor.     Total time spent on today's visit was 41 minutes, including both face-to-face time and nonface-to-face time.  Time included that spent on review of records (prior notes available to me/labs/imaging if pertinent), discussing treatment and goals, answering patient's questions and coordinating care.  Cc:  Emelda Fear, DO

## 2022-05-04 ENCOUNTER — Encounter: Payer: Self-pay | Admitting: Neurology

## 2022-05-04 ENCOUNTER — Ambulatory Visit: Payer: BLUE CROSS/BLUE SHIELD | Admitting: Neurology

## 2022-05-04 VITALS — BP 143/83 | HR 62 | Ht 60.0 in | Wt 180.0 lb

## 2022-05-04 DIAGNOSIS — R251 Tremor, unspecified: Secondary | ICD-10-CM | POA: Diagnosis not present

## 2022-05-04 DIAGNOSIS — F419 Anxiety disorder, unspecified: Secondary | ICD-10-CM

## 2022-05-04 DIAGNOSIS — D61818 Other pancytopenia: Secondary | ICD-10-CM

## 2022-05-04 NOTE — Patient Instructions (Addendum)
We discussed functional tremor.  The website we discussed is: [email or web address]  We put in a referral for [place] to the therapist we discussed  It was good to see you today!  The physicians and staff at [facility] are committed to providing excellent care. You may receive a survey requesting feedback about your experience at our office. We strive to receive "very good" responses to the survey questions. If you feel that your experience would prevent you from giving the office a "very good " response, please contact our office to try to remedy the situation. We may be reached at [phone]. Thank you for taking the time out of your busy day to complete the survey.

## 2022-05-05 ENCOUNTER — Telehealth: Payer: Self-pay

## 2022-05-05 NOTE — Telephone Encounter (Signed)
Called and reported to her the answer from Dr. Carles Collet for OT for the shaky writing. Said she would call back after talking to husband/

## 2022-06-22 ENCOUNTER — Ambulatory Visit (INDEPENDENT_AMBULATORY_CARE_PROVIDER_SITE_OTHER): Payer: BLUE CROSS/BLUE SHIELD | Admitting: Behavioral Health

## 2022-06-22 DIAGNOSIS — F419 Anxiety disorder, unspecified: Secondary | ICD-10-CM | POA: Diagnosis not present

## 2022-06-22 DIAGNOSIS — Z62890 Parent-child estrangement NEC: Secondary | ICD-10-CM

## 2022-06-22 DIAGNOSIS — Z638 Other specified problems related to primary support group: Secondary | ICD-10-CM | POA: Diagnosis not present

## 2022-06-22 DIAGNOSIS — F32A Depression, unspecified: Secondary | ICD-10-CM | POA: Diagnosis not present

## 2022-06-22 NOTE — Progress Notes (Signed)
Behavioral Health Counselor Initial Adult Exam  Name: Stephanie Bauer Date: 06/22/2022 MRN: [id number] DOB: 06-07-1955 PCP: Lorelei Pont, DO  Time spent: 60 min In Person @ [facility] w/[facility] Stephanie Bauer  Guardian/Payee:  Self    Paperwork requested: No   Reason for Visit /Presenting Problem: Elevated anx/dep & stress in the FOO system w/estrangement, betrayal, & bullying by 2/3 Sons  Mental Status Exam: Appearance:   Casual     Behavior:  Appropriate and Sharing  Motor:  Normal  Speech/Language:   Clear and Coherent  Affect:  Appropriate  Mood:  anxious  Thought process:  normal  Thought content:    WNL  Sensory/Perceptual disturbances:    WNL  Orientation:  oriented to person, place, and time/date  Attention:  Good  Concentration:  Good  Memory:  WNL  Fund of knowledge:   Good  Insight:    Good  Judgment:   Good  Impulse Control:  Good    Risk Assessment: Danger to Self:  No Self-injurious Behavior: No Danger to Others: No Duty to Warn:no Physical Aggression / Violence:No  Access to Firearms a concern: No  Gang Involvement:No  Patient / guardian was educated about steps to take if suicide or homicide risk level increases between visits: yes; appropriate to ICD process While future psychiatric events cannot be accurately predicted, the patient does not currently require acute inpatient psychiatric care and does not currently meet [facility] involuntary commitment criteria.  Substance Abuse History: Current substance abuse: No     Past Psychiatric History:   No previous psychological problems have been observed Outpatient Providers: Lurena Joiner Tat, DO History of Psych Hospitalization: No  Psychological Testing:  NA    Abuse History:  Victim of: No.,  NA    Report needed: No. Victim of Neglect:No. Perpetrator of  NA   Witness / Exposure to Domestic Violence: No   Protective Services Involvement: No  Witness to [employer] Violence:  No   Family  History:  Family History  Problem Relation Age of Onset   Healthy Mother    Diabetes Father    Multiple sclerosis Sister    Healthy Brother    Parkinson's disease Maternal Uncle    Parkinson's disease Maternal Uncle    Healthy Son    Healthy Son    Healthy Son     Living situation: the patient lives with their spouse  Sexual Orientation: Straight  Relationship Status: married  Name of spouse: Stephanie Bauer; 67th Anniversary next Wed, July 01, 2022 If a parent, number of children / ages:31yo Son Marlene Bast married to Wife Momence. 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; Tax adviser: No   Educational History: Education: high school diploma/GED  Religion/Sprituality/World View: Unk  Any cultural differences that may affect / interfere with treatment:  None noted today  Recreation/Hobbies: Caring for the [employer] [address]  Stressors: Health problems   Marital or family conflict    Strengths: Supportive Relationships, Family, Friends, Hopefulness, Journalist, newspaper, and Able to Communicate Effectively  Barriers:  None noted today & Estrangement is complex for GrandParents   Legal History: Pending legal issue / charges: The patient has no significant history of legal issues. History of legal issue / charges:  NA  Medical History/Surgical History: reviewed Past Medical History:  Diagnosis Date  Borderline diabetes    Cirrhosis (HCC)    diagnosed in 2013   GERD (gastroesophageal reflux disease)    Hx of melanoma excision    2013   Kidney calculus    Melanoma (HCC) 1957-[birth year]   in situ-chest   NASH (nonalcoholic steatohepatitis)    Pancreatitis    Thrombocytopenia (HCC)     Past Surgical History:  Procedure Laterality Date   APPENDECTOMY     CESAREAN SECTION     x2    CHOLECYSTECTOMY     COLONOSCOPY WITH ESOPHAGOGASTRODUODENOSCOPY (EGD)  02/19/2012   Procedure: COLONOSCOPY WITH ESOPHAGOGASTRODUODENOSCOPY (EGD);  Surgeon: Malissa Hippo, MD;  Location: [facility];  Service: Endoscopy;  Laterality: N/A;  135-changed to 14:25 Ann to notify pt   ESOPHAGEAL BANDING N/A 05/23/2015   Procedure: ESOPHAGEAL BANDING;  Surgeon: Malissa Hippo, MD;  Location: [facility];  Service: Endoscopy;  Laterality: N/A;   ESOPHAGOGASTRODUODENOSCOPY N/A 05/23/2015   Procedure: ESOPHAGOGASTRODUODENOSCOPY (EGD);  Surgeon: Malissa Hippo, MD;  Location: [facility];  Service: Endoscopy;  Laterality: N/A;  12:30   LITHOTRIPSY     SKIN BIOPSY     face and upper chest right   VAGINAL HYSTERECTOMY      Medications: Current Outpatient Medications  Medication Sig Dispense Refill   ALPRAZolam (XANAX) 0.5 MG tablet Take 0.5 mg by mouth at bedtime as needed. Sleep.     estradiol (ESTRACE) 0.1 MG/GM vaginal cream Place 1 Applicatorful vaginally at bedtime.     furosemide (LASIX) 20 MG tablet Take 20 mg by mouth.     lactulose (CHRONULAC) 10 GM/15ML solution Take 20 g by mouth 3 (three) times daily.     multivitamin-iron-minerals-folic acid (CENTRUM) chewable tablet Chew 1 tablet by mouth daily.     omeprazole (PRILOSEC) 20 MG capsule Take 20 mg by mouth daily.     XIFAXAN 550 MG TABS tablet SMARTSIG:1 Tablet(s) By Mouth Every 12 Hours     No current facility-administered medications for this visit.    Allergies  Allergen Reactions   Zantac [Ranitidine Hcl] Anaphylaxis    Diagnoses:  Anxiety and depression  Family disruption due to parent-child estrangement  Plan of Care: [place] will attend all sessions as scheduled once monthly. She will clarify her Referral from Dr. Lurena Joiner Tat, DO & make sure she is not in need of OT, PT, or RT. Husb will attend sessions w/Pt & next appt they will complete the PHQ-9 & GAD-7 screeners.  Target Date: 08/06/2022  Progress:  4  Frequency: Once monthly or as schedule allows  Modality: Cpl Th   Deneise Lever, LMFT

## 2022-06-22 NOTE — Progress Notes (Signed)
                Kayzlee Wirtanen L Khaidyn Staebell, LMFT 

## 2022-07-07 ENCOUNTER — Ambulatory Visit: Payer: BLUE CROSS/BLUE SHIELD | Admitting: Behavioral Health

## 2023-01-25 NOTE — Progress Notes (Unsigned)
    Assessment/Plan:   1.  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  -She was previously given levodopa and did not tolerate it.  I do not think she needs it.  She does not meet [place] brain bank criteria for the diagnosis of Parkinsons disease.  -Nature of functional tremor discussed in detail and discussed fndhope website.  -Patient under a lot of family stress with children.  -Was agreeable to referring to counseling for cognitive behavioral therapy.  -could do skin bx but would defer given low platelets and think that risk>benefits.  She agrees.  -Told her I would follow her back up in the next 8-9 months to see how she is doing and make sure that she is not developing anything else, since another movement disorder neurologist did think the diagnosis was Parkinson's disease.     Subjective:   Stephanie Bauer was seen today in follow up for tremor.  My previous records were reviewed prior to todays visit as well as outside records available to me.  When I saw the patient last visit, we discussed functional nature of tremor.  We referred her to [facility] behavioral medicine for counseling/cognitive behavioral therapy.  Unfortunately, she did not get seen.  This was back in February.  I did want to see her back today just to make sure that nothing else was developing.  She has previously had a negative DaTscan however.   ALLERGIES:   Allergies  Allergen Reactions   Zantac [Ranitidine Hcl] Anaphylaxis    CURRENT MEDICATIONS:  No outpatient medications have been marked as taking for the 01/27/23 encounter (Appointment) with Shar Paez, Octaviano Batty, DO.     Objective:   PHYSICAL EXAMINATION:    VITALS:   There were no vitals filed for this visit.   GEN:  The patient appears stated age and is in NAD. HEENT:  Normocephalic, atraumatic.  The mucous membranes are moist. The superficial temporal arteries are without ropiness or tenderness. CV:  RRR Lungs:   CTAB Neck/HEME:  There are no carotid bruits bilaterally.  Neurological examination:  Orientation: The patient is alert and oriented x3. Cranial nerves: There is good facial symmetry without facial hypomimia. The speech is fluent and clear. Soft palate rises symmetrically and there is no tongue deviation. Hearing is intact to conversational tone. Sensation: Sensation is intact to light touch throughout Motor: Strength is at least antigravity x4.  Movement examination: Tone: There is normal tone in the bilateral upper extremities.  The tone in the lower extremities is normal.  Abnormal movements: No rest tremor noted today.  There is a postural tremor that is irregular and has some entrainment.  This is mostly on the left.   Coordination:  There is no decremation with RAM's, with any form of RAMS, including alternating supination and pronation of the forearm, hand opening and closing, finger taps, heel taps and toe taps. Gait and Station: I watched the patient ambulate from the scale to the exam room when she was unaware I was watching.  She ambulated well, with good armswing, without resting tremor.     Total time spent on today's visit was *** minutes, including both face-to-face time and nonface-to-face time.  Time included that spent on review of records (prior notes available to me/labs/imaging if pertinent), discussing treatment and goals, answering patient's questions and coordinating care.  Cc:  Lorelei Pont, DO

## 2023-01-27 ENCOUNTER — Ambulatory Visit: Payer: BLUE CROSS/BLUE SHIELD | Admitting: Neurology

## 2023-01-27 ENCOUNTER — Encounter: Payer: Self-pay | Admitting: Neurology

## 2023-01-27 VITALS — BP 116/70 | HR 68 | Ht 60.0 in | Wt 164.2 lb

## 2023-01-27 DIAGNOSIS — R251 Tremor, unspecified: Secondary | ICD-10-CM

## 2023-03-12 ENCOUNTER — Telehealth: Payer: Self-pay | Admitting: Neurology

## 2023-03-12 NOTE — Telephone Encounter (Signed)
 Pt called no answer left a voice mail to call the office back

## 2023-03-12 NOTE — Telephone Encounter (Signed)
 Pt left message, returning call for [facility]

## 2023-03-12 NOTE — Telephone Encounter (Signed)
 Called patient and left a message for a call back.

## 2023-03-12 NOTE — Telephone Encounter (Signed)
 Please call and let pt know that we were originially waiting for her to be on medicare to apply for [employer].  Since that time, we have learned that she would not qualify for that device unfortunately due to medicare regulations (not indicated for her type of tremor, not tried meds b/c they don't work for this type of tremor, etc)

## 2023-03-15 NOTE — Telephone Encounter (Signed)
 Just send her a mychart message about the correspondence
